# Patient Record
Sex: Female | Born: 1980 | Race: Black or African American | Hispanic: No | Marital: Married | Smoking: Never smoker
Health system: Southern US, Community
[De-identification: ages and names within clinical notes are randomized; demographics above are authoritative.]

## PROBLEM LIST (undated history)

## (undated) ENCOUNTER — Emergency Department (HOSPITAL_COMMUNITY): Admission: EM | Payer: Managed Care, Other (non HMO) | Source: Home / Self Care

## (undated) DIAGNOSIS — T4145XA Adverse effect of unspecified anesthetic, initial encounter: Secondary | ICD-10-CM

## (undated) DIAGNOSIS — T8859XA Other complications of anesthesia, initial encounter: Secondary | ICD-10-CM

## (undated) DIAGNOSIS — D6859 Other primary thrombophilia: Secondary | ICD-10-CM

## (undated) DIAGNOSIS — I82409 Acute embolism and thrombosis of unspecified deep veins of unspecified lower extremity: Secondary | ICD-10-CM

## (undated) HISTORY — DX: Acute embolism and thrombosis of unspecified deep veins of unspecified lower extremity: I82.409

---

## 2007-02-03 ENCOUNTER — Ambulatory Visit: Payer: Self-pay | Admitting: Hematology and Oncology

## 2007-02-12 LAB — CBC WITH DIFFERENTIAL/PLATELET
Basophils Absolute: 0 10*3/uL (ref 0.0–0.1)
EOS%: 0.8 % (ref 0.0–7.0)
Eosinophils Absolute: 0.1 10*3/uL (ref 0.0–0.5)
HCT: 35.4 % (ref 34.8–46.6)
HGB: 11.7 g/dL (ref 11.6–15.9)
MONO#: 0.4 10*3/uL (ref 0.1–0.9)
NEUT#: 4.5 10*3/uL (ref 1.5–6.5)
RDW: 14.8 % — ABNORMAL HIGH (ref 11.3–14.5)
WBC: 7.3 10*3/uL (ref 3.9–10.0)
lymph#: 2.3 10*3/uL (ref 0.9–3.3)

## 2007-02-18 LAB — COMPREHENSIVE METABOLIC PANEL
AST: 15 U/L (ref 0–37)
Albumin: 3.4 g/dL — ABNORMAL LOW (ref 3.5–5.2)
BUN: 11 mg/dL (ref 6–23)
CO2: 27 mEq/L (ref 19–32)
Calcium: 8.9 mg/dL (ref 8.4–10.5)
Chloride: 102 mEq/L (ref 96–112)
Glucose, Bld: 77 mg/dL (ref 70–99)
Potassium: 4.3 mEq/L (ref 3.5–5.3)

## 2007-02-18 LAB — HYPERCOAGULABLE PANEL, COMPREHENSIVE
AntiThromb III Func: 99 % (ref 76–126)
Anticardiolipin IgM: 11 [MPL'U] (ref <10)
Beta-2 Glyco I IgG: 17 U/mL (ref <20)
Beta-2-Glycoprotein I IgM: 19 U/mL (ref <10)
Protein C, Total: 85 % (ref 70–140)
Protein S Activity: 58 % — ABNORMAL LOW (ref 69–129)

## 2008-03-04 ENCOUNTER — Other Ambulatory Visit: Admission: RE | Admit: 2008-03-04 | Discharge: 2008-03-04 | Payer: Self-pay | Admitting: Family Medicine

## 2009-03-07 ENCOUNTER — Other Ambulatory Visit: Admission: RE | Admit: 2009-03-07 | Discharge: 2009-03-07 | Payer: Self-pay | Admitting: Family Medicine

## 2010-03-24 ENCOUNTER — Other Ambulatory Visit
Admission: RE | Admit: 2010-03-24 | Discharge: 2010-03-24 | Payer: Self-pay | Source: Home / Self Care | Admitting: Obstetrics and Gynecology

## 2013-04-15 ENCOUNTER — Encounter (HOSPITAL_COMMUNITY): Payer: 59 | Admitting: Anesthesiology

## 2013-04-15 ENCOUNTER — Other Ambulatory Visit: Payer: Self-pay | Admitting: Obstetrics and Gynecology

## 2013-04-15 ENCOUNTER — Inpatient Hospital Stay (HOSPITAL_COMMUNITY)
Admission: AD | Admit: 2013-04-15 | Discharge: 2013-04-18 | DRG: 765 | Disposition: A | Discharge status: Home / Self Care | Payer: 59 | Source: Ambulatory Visit | Attending: Obstetrics and Gynecology | Admitting: Obstetrics and Gynecology

## 2013-04-15 ENCOUNTER — Inpatient Hospital Stay (HOSPITAL_COMMUNITY): Payer: 59 | Admitting: Anesthesiology

## 2013-04-15 ENCOUNTER — Encounter (HOSPITAL_COMMUNITY): Payer: Self-pay | Admitting: *Deleted

## 2013-04-15 DIAGNOSIS — Z98891 History of uterine scar from previous surgery: Secondary | ICD-10-CM

## 2013-04-15 DIAGNOSIS — O324XX0 Maternal care for high head at term, not applicable or unspecified: Secondary | ICD-10-CM | POA: Diagnosis present

## 2013-04-15 DIAGNOSIS — O9912 Other diseases of the blood and blood-forming organs and certain disorders involving the immune mechanism complicating childbirth: Secondary | ICD-10-CM

## 2013-04-15 DIAGNOSIS — O34219 Maternal care for unspecified type scar from previous cesarean delivery: Secondary | ICD-10-CM | POA: Diagnosis present

## 2013-04-15 DIAGNOSIS — O480 Post-term pregnancy: Secondary | ICD-10-CM | POA: Diagnosis present

## 2013-04-15 DIAGNOSIS — E669 Obesity, unspecified: Secondary | ICD-10-CM | POA: Diagnosis present

## 2013-04-15 DIAGNOSIS — D689 Coagulation defect, unspecified: Secondary | ICD-10-CM | POA: Diagnosis present

## 2013-04-15 DIAGNOSIS — O365990 Maternal care for other known or suspected poor fetal growth, unspecified trimester, not applicable or unspecified: Secondary | ICD-10-CM | POA: Diagnosis present

## 2013-04-15 DIAGNOSIS — O99214 Obesity complicating childbirth: Secondary | ICD-10-CM

## 2013-04-15 DIAGNOSIS — D6859 Other primary thrombophilia: Secondary | ICD-10-CM | POA: Diagnosis present

## 2013-04-15 HISTORY — DX: Other primary thrombophilia: D68.59

## 2013-04-15 LAB — CBC
HCT: 33.9 % — ABNORMAL LOW (ref 36.0–46.0)
HEMOGLOBIN: 10.9 g/dL — AB (ref 12.0–15.0)
MCH: 26.5 pg (ref 26.0–34.0)
MCHC: 32.2 g/dL (ref 30.0–36.0)
MCV: 82.5 fL (ref 78.0–100.0)
Platelets: 254 10*3/uL (ref 150–400)
RBC: 4.11 MIL/uL (ref 3.87–5.11)
RDW: 16 % — ABNORMAL HIGH (ref 11.5–15.5)
WBC: 9.4 10*3/uL (ref 4.0–10.5)

## 2013-04-15 LAB — RPR: RPR: NONREACTIVE

## 2013-04-15 LAB — OB RESULTS CONSOLE RPR: RPR: NONREACTIVE

## 2013-04-15 LAB — OB RESULTS CONSOLE GC/CHLAMYDIA
CHLAMYDIA, DNA PROBE: NEGATIVE
GC PROBE AMP, GENITAL: NEGATIVE

## 2013-04-15 LAB — PREPARE RBC (CROSSMATCH)

## 2013-04-15 LAB — OB RESULTS CONSOLE ABO/RH: RH Type: POSITIVE

## 2013-04-15 LAB — OB RESULTS CONSOLE HEPATITIS B SURFACE ANTIGEN: Hepatitis B Surface Ag: NEGATIVE

## 2013-04-15 LAB — OB RESULTS CONSOLE HIV ANTIBODY (ROUTINE TESTING): HIV: NONREACTIVE

## 2013-04-15 LAB — OB RESULTS CONSOLE GBS: GBS: NEGATIVE

## 2013-04-15 LAB — OB RESULTS CONSOLE ANTIBODY SCREEN: Antibody Screen: NEGATIVE

## 2013-04-15 LAB — OB RESULTS CONSOLE RUBELLA ANTIBODY, IGM: Rubella: NON-IMMUNE/NOT IMMUNE

## 2013-04-15 MED ORDER — EPHEDRINE 5 MG/ML INJ
10.0000 mg | INTRAVENOUS | Status: DC | PRN
  Administered 2013-04-16: 10 mg via INTRAVENOUS

## 2013-04-15 MED ORDER — LACTATED RINGERS IV SOLN
INTRAVENOUS | Status: DC
  Administered 2013-04-15: 18:00:00 via INTRAVENOUS
  Administered 2013-04-16: 125 mL/h via INTRAVENOUS
  Administered 2013-04-16: 09:00:00 via INTRAVENOUS

## 2013-04-15 MED ORDER — PHENYLEPHRINE 40 MCG/ML (10ML) SYRINGE FOR IV PUSH (FOR BLOOD PRESSURE SUPPORT)
80.0000 ug | PREFILLED_SYRINGE | INTRAVENOUS | Status: DC | PRN
  Filled 2013-04-15: qty 10

## 2013-04-15 MED ORDER — OXYTOCIN 40 UNITS IN LACTATED RINGERS INFUSION - SIMPLE MED
1.0000 m[IU]/min | INTRAVENOUS | Status: DC
  Administered 2013-04-15: 2 m[IU]/min via INTRAVENOUS
  Filled 2013-04-15: qty 1000

## 2013-04-15 MED ORDER — DIPHENHYDRAMINE HCL 50 MG/ML IJ SOLN: 12.5000 mg | INTRAMUSCULAR | Status: DC | PRN

## 2013-04-15 MED ORDER — CITRIC ACID-SODIUM CITRATE 334-500 MG/5ML PO SOLN
30.0000 mL | ORAL | Status: DC | PRN
  Administered 2013-04-16: 30 mL via ORAL
  Filled 2013-04-15: qty 15

## 2013-04-15 MED ORDER — OXYTOCIN BOLUS FROM INFUSION
500.0000 mL | INTRAVENOUS | Status: DC
Start: 2013-04-15 — End: 2013-04-16

## 2013-04-15 MED ORDER — IBUPROFEN 600 MG PO TABS: 600.0000 mg | ORAL_TABLET | Freq: Four times a day (QID) | ORAL | Status: DC | PRN

## 2013-04-15 MED ORDER — OXYTOCIN 40 UNITS IN LACTATED RINGERS INFUSION - SIMPLE MED
62.5000 mL/h | INTRAVENOUS | Status: DC
Start: 2013-04-15 — End: 2013-04-16

## 2013-04-15 MED ORDER — LIDOCAINE HCL (PF) 1 % IJ SOLN
INTRAMUSCULAR | Status: DC | PRN
  Administered 2013-04-15 – 2013-04-16 (×4): 5 mL
  Administered 2013-04-16: 3 mL
  Administered 2013-04-16: 5 mL

## 2013-04-15 MED ORDER — LACTATED RINGERS IV SOLN
500.0000 mL | Freq: Once | INTRAVENOUS | Status: AC
  Administered 2013-04-15: 500 mL via INTRAVENOUS

## 2013-04-15 MED ORDER — ONDANSETRON HCL 4 MG/2ML IJ SOLN
4.0000 mg | Freq: Four times a day (QID) | INTRAMUSCULAR | Status: DC | PRN
Start: 2013-04-15 — End: 2013-04-16

## 2013-04-15 MED ORDER — FENTANYL 2.5 MCG/ML BUPIVACAINE 1/10 % EPIDURAL INFUSION (WH - ANES)
14.0000 mL/h | INTRAMUSCULAR | Status: DC | PRN
  Administered 2013-04-16: 14 mL/h via EPIDURAL
  Filled 2013-04-15 (×2): qty 125

## 2013-04-15 MED ORDER — OXYCODONE-ACETAMINOPHEN 5-325 MG PO TABS: 1.0000 | ORAL_TABLET | ORAL | Status: DC | PRN

## 2013-04-15 MED ORDER — ACETAMINOPHEN 325 MG PO TABS
650.0000 mg | ORAL_TABLET | ORAL | Status: DC | PRN
Start: 2013-04-15 — End: 2013-04-16

## 2013-04-15 MED ORDER — LACTATED RINGERS IV SOLN
500.0000 mL | INTRAVENOUS | Status: DC | PRN
  Administered 2013-04-16 (×2): 500 mL via INTRAVENOUS

## 2013-04-15 MED ORDER — EPHEDRINE 5 MG/ML INJ
10.0000 mg | INTRAVENOUS | Status: DC | PRN
  Filled 2013-04-15 (×2): qty 4

## 2013-04-15 MED ORDER — LIDOCAINE HCL (PF) 1 % IJ SOLN
30.0000 mL | INTRAMUSCULAR | Status: DC | PRN
  Filled 2013-04-15: qty 30

## 2013-04-15 MED ORDER — FENTANYL 2.5 MCG/ML BUPIVACAINE 1/10 % EPIDURAL INFUSION (WH - ANES)
INTRAMUSCULAR | Status: DC | PRN
  Administered 2013-04-15: 14 mL/h via EPIDURAL

## 2013-04-15 MED ORDER — TERBUTALINE SULFATE 1 MG/ML IJ SOLN: 0.2500 mg | Freq: Once | INTRAMUSCULAR | Status: AC | PRN

## 2013-04-15 MED ORDER — BUTORPHANOL TARTRATE 1 MG/ML IJ SOLN: 1.0000 mg | INTRAMUSCULAR | Status: DC | PRN

## 2013-04-15 NOTE — Progress Notes (Signed)
  Subjective: Pt is comfortable and coping well with UCs, pt voiced understanding about availability of epidural.    Objective: BP 122/93  Pulse 103  Temp(Src) 97.4 F (36.3 C) (Oral)  Resp 18  Ht 5' (1.524 m)  Wt 208 lb (94.348 kg)  BMI 40.62 kg/m2  LMP 07/03/2012      FHT:  Cat I UC:   regular, every 2-5 minutes  SVE:   Dilation: 3 Effacement (%): 90 Station: 0 Exam by:: Casper Harrison, CNM  Assessment / Plan:  Induction of labor due to BPP 6/8 and post-dates,  progressing well on pitocin  Labor: Progressing on Pitocin, will continue to increase then AROM when appropriate Preeclampsia: no signs or symptoms of toxicity  Fetal Wellbeing: Category I  Pain Control: Labor support without medications  I/D: GBS neg; Intact; Afebrile  Anticipated MOD: NSVD   Brenda Rivers 04/15/2013, 10:06 PM

## 2013-04-15 NOTE — Anesthesia Procedure Notes (Addendum)
Epidural Patient location during procedure: OB  Staffing Anesthesiologist: Montez Hageman Performed by: anesthesiologist   Preanesthetic Checklist Completed: patient identified, site marked, surgical consent, pre-op evaluation, timeout performed, IV checked, risks and benefits discussed and monitors and equipment checked  Epidural Patient position: sitting Prep: ChloraPrep Patient monitoring: heart rate, continuous pulse ox and blood pressure Approach: right paramedian Injection technique: LOR saline  Needle:  Needle type: Tuohy  Needle gauge: 17 G Needle length: 9 cm and 9 Needle insertion depth: 9 cm Catheter type: closed end flexible Catheter size: 20 Guage Catheter at skin depth: 14 cm Test dose: negative  Assessment Events: blood not aspirated, injection not painful, no injection resistance, negative IV test and no paresthesia  Additional Notes   Patient tolerated the insertion well without complications.  Epidural Patient location during procedure: OB  Staffing Anesthesiologist: Montez Hageman Performed by: anesthesiologist   Preanesthetic Checklist Completed: patient identified, site marked, surgical consent, pre-op evaluation, timeout performed, IV checked, risks and benefits discussed and monitors and equipment checked  Epidural Patient position: sitting Prep: ChloraPrep Patient monitoring: heart rate, continuous pulse ox and blood pressure Approach: midline Injection technique: LOR saline  Needle:  Needle type: Tuohy  Needle gauge: 17 G Needle length: 9 cm and 9 Needle insertion depth: 8 cm Catheter type: closed end flexible Catheter size: 20 Guage Catheter at skin depth: 13 cm Test dose: negative  Assessment Events: blood not aspirated, injection not painful, no injection resistance, negative IV test and no paresthesia  Additional Notes Old catheter removed. Tip intact.  Patient tolerated the insertion well without complications.

## 2013-04-15 NOTE — MAU Note (Signed)
Pt presents for induction, BPP 6/8 on baby by ultrasound today.

## 2013-04-15 NOTE — Anesthesia Preprocedure Evaluation (Signed)
Anesthesia Evaluation  Patient identified by MRN, date of birth, ID band Patient awake    Reviewed: Allergy & Precautions, H&P , NPO status , Patient's Chart, lab work & pertinent test results  History of Anesthesia Complications Negative for: history of anesthetic complications  Airway Mallampati: II TM Distance: >3 FB Neck ROM: full    Dental no notable dental hx. (+) Teeth Intact   Pulmonary neg pulmonary ROS,  breath sounds clear to auscultation  Pulmonary exam normal       Cardiovascular negative cardio ROS  Rhythm:regular Rate:Normal     Neuro/Psych negative neurological ROS  negative psych ROS   GI/Hepatic negative GI ROS, Neg liver ROS,   Endo/Other  negative endocrine ROSMorbid obesity  Renal/GU negative Renal ROS  negative genitourinary   Musculoskeletal   Abdominal Normal abdominal exam  (+)   Peds  Hematology negative hematology ROS (+)   Anesthesia Other Findings   Reproductive/Obstetrics (+) Pregnancy                           Anesthesia Physical Anesthesia Plan  ASA: III  Anesthesia Plan: Epidural   Post-op Pain Management:    Induction:   Airway Management Planned:   Additional Equipment:   Intra-op Plan:   Post-operative Plan:   Informed Consent: I have reviewed the patients History and Physical, chart, labs and discussed the procedure including the risks, benefits and alternatives for the proposed anesthesia with the patient or authorized representative who has indicated his/her understanding and acceptance.     Plan Discussed with:   Anesthesia Plan Comments:         Anesthesia Quick Evaluation  

## 2013-04-15 NOTE — H&P (Addendum)
Krysten Marines is a 33 y.o. female G2 P1001 at 40 wks and 6 days  presenting for induction due to abnormal bpp in the office today of 6 out 8 and post dates. Her efw today was less than 10%ile  6 lbs +/- 8 oz.  Her pregnancy has been complicated by h/o cesarean section.  And Protein S deficiency. She desires a trial of labor.     History OB History   Grav Para Term Preterm Abortions TAB SAB Ect Mult Living   2 1 1  0 0 0 0 0 0 1     Past Medical History  Diagnosis Date  . Protein S deficiency    Past Surgical History  Procedure Laterality Date  . Cesarean section     Family History: family history is not on file. Social History:  reports that she has never smoked. She has never used smokeless tobacco. She reports that she does not drink alcohol or use illicit drugs.  Medications:  Heparin last injection was 04/12/2012 PNV   Allergies NkDA     Prenatal Transfer Tool  Maternal Diabetes: No Genetic Screening: Normal Maternal Ultrasounds/Referrals: Normal Fetal Ultrasounds or other Referrals:  None Maternal Substance Abuse:  No Significant Maternal Medications:  None Significant Maternal Lab Results:  None Other Comments:  None  Review of Systems  All other systems reviewed and are negative.      Blood pressure 110/71, pulse 109, temperature 98.3 F (36.8 C), temperature source Oral, resp. rate 18, height 5' (1.524 m), weight 94.348 kg (208 lb), last menstrual period 07/03/2012. Maternal Exam:  Abdomen: Patient reports no abdominal tenderness. Surgical scars: low transverse.   Fetal presentation: vertex  Introitus: Normal vulva. Normal vagina.    Fetal Exam Fetal Monitor Review: Mode: fetoscope.   Variability: moderate (6-25 bpm).   Pattern: no decelerations.    Fetal State Assessment: Category I - tracings are normal.     Physical Exam  Constitutional: She is oriented to person, place, and time.  HENT:  Head: Normocephalic.  Cardiovascular: Normal rate and  regular rhythm.   Respiratory: Effort normal.  Genitourinary: Vagina normal.  Cervix 1.5/ 75/-1  Musculoskeletal: Normal range of motion.  Neurological: She is alert and oriented to person, place, and time.  Skin: Skin is warm and dry.  Psychiatric: She has a normal mood and affect.    Prenatal labs: ABO, Rh:   A positive    Antibody:  Negative   Rubella:  Nonimmune  RPR:   Nonreactive  HBsAg:   Negative  HIV:   Negative  GBS:   Negative   Assessment/Plan: 40 wks and 6 days with abnormal bpp 6 out 8 postdates for delivery.  H/o cesarean section pt desires TOL .Marland Kitchen Pt informed of 1 % r/o uterine rupture with possibility of resulting maternal and fetal morbidity and mortality. She voiced understanding and desires to attempt VBAC Protein S deficiency. SCD's.. Plan restart lovenox if breastfeeding 24 hours post delivery for 6 weeks.    Keatyn Jawad J. 04/15/2013, 3:49 PM

## 2013-04-16 ENCOUNTER — Encounter (HOSPITAL_COMMUNITY): Payer: Self-pay | Admitting: *Deleted

## 2013-04-16 ENCOUNTER — Encounter (HOSPITAL_COMMUNITY)
Admission: AD | Disposition: A | Discharge status: Home / Self Care | Payer: Self-pay | Source: Ambulatory Visit | Attending: Obstetrics and Gynecology

## 2013-04-16 LAB — ABO/RH: ABO/RH(D): A POS

## 2013-04-16 SURGERY — Surgical Case
Anesthesia: Epidural

## 2013-04-16 MED ORDER — MEASLES, MUMPS & RUBELLA VAC ~~LOC~~ INJ
0.5000 mL | INJECTION | Freq: Once | SUBCUTANEOUS | Status: AC
  Administered 2013-04-18: 0.5 mL via SUBCUTANEOUS
  Filled 2013-04-16 (×2): qty 0.5

## 2013-04-16 MED ORDER — DIPHENHYDRAMINE HCL 25 MG PO CAPS: 25.0000 mg | ORAL_CAPSULE | Freq: Four times a day (QID) | ORAL | Status: DC | PRN

## 2013-04-16 MED ORDER — ZOLPIDEM TARTRATE 5 MG PO TABS
5.0000 mg | ORAL_TABLET | Freq: Every evening | ORAL | Status: DC | PRN
Start: 2013-04-16 — End: 2013-04-18

## 2013-04-16 MED ORDER — KETOROLAC TROMETHAMINE 30 MG/ML IJ SOLN
INTRAMUSCULAR | Status: AC
  Filled 2013-04-16: qty 1

## 2013-04-16 MED ORDER — IBUPROFEN 600 MG PO TABS
600.0000 mg | ORAL_TABLET | Freq: Four times a day (QID) | ORAL | Status: DC | PRN
  Administered 2013-04-17: 600 mg via ORAL
  Filled 2013-04-16: qty 1

## 2013-04-16 MED ORDER — NALBUPHINE HCL 10 MG/ML IJ SOLN: 5.0000 mg | INTRAMUSCULAR | Status: DC | PRN

## 2013-04-16 MED ORDER — NALOXONE HCL 1 MG/ML IJ SOLN
1.0000 ug/kg/h | INTRAVENOUS | Status: DC | PRN
  Filled 2013-04-16: qty 2

## 2013-04-16 MED ORDER — PHENYLEPHRINE 8 MG IN D5W 100 ML (0.08MG/ML) PREMIX OPTIME
INJECTION | INTRAVENOUS | Status: AC
  Filled 2013-04-16: qty 100

## 2013-04-16 MED ORDER — MIDAZOLAM HCL 2 MG/2ML IJ SOLN: 0.5000 mg | Freq: Once | INTRAMUSCULAR | Status: DC | PRN

## 2013-04-16 MED ORDER — METOCLOPRAMIDE HCL 5 MG/ML IJ SOLN: 10.0000 mg | Freq: Three times a day (TID) | INTRAMUSCULAR | Status: DC | PRN

## 2013-04-16 MED ORDER — MEPERIDINE HCL 25 MG/ML IJ SOLN: 6.2500 mg | INTRAMUSCULAR | Status: DC | PRN

## 2013-04-16 MED ORDER — BUPIVACAINE HCL (PF) 0.25 % IJ SOLN
INTRAMUSCULAR | Status: DC | PRN
  Administered 2013-04-16 (×2): 5 mL via EPIDURAL

## 2013-04-16 MED ORDER — MORPHINE SULFATE 0.5 MG/ML IJ SOLN
INTRAMUSCULAR | Status: AC
  Filled 2013-04-16: qty 10

## 2013-04-16 MED ORDER — NALOXONE HCL 0.4 MG/ML IJ SOLN: 0.4000 mg | INTRAMUSCULAR | Status: DC | PRN

## 2013-04-16 MED ORDER — OXYTOCIN 10 UNIT/ML IJ SOLN
INTRAMUSCULAR | Status: AC
  Filled 2013-04-16: qty 4

## 2013-04-16 MED ORDER — SCOPOLAMINE 1 MG/3DAYS TD PT72
MEDICATED_PATCH | TRANSDERMAL | Status: AC
  Filled 2013-04-16: qty 1

## 2013-04-16 MED ORDER — 0.9 % SODIUM CHLORIDE (POUR BTL) OPTIME
TOPICAL | Status: DC | PRN
  Administered 2013-04-16: 1000 mL

## 2013-04-16 MED ORDER — OXYTOCIN 10 UNIT/ML IJ SOLN
40.0000 [IU] | INTRAVENOUS | Status: DC | PRN
  Administered 2013-04-16: 40 [IU] via INTRAVENOUS

## 2013-04-16 MED ORDER — CEFAZOLIN SODIUM-DEXTROSE 2-3 GM-% IV SOLR
2.0000 g | INTRAVENOUS | Status: AC
  Administered 2013-04-16: 2 g via INTRAVENOUS
  Filled 2013-04-16: qty 50

## 2013-04-16 MED ORDER — ONDANSETRON HCL 4 MG PO TABS: 4.0000 mg | ORAL_TABLET | ORAL | Status: DC | PRN

## 2013-04-16 MED ORDER — OXYTOCIN 40 UNITS IN LACTATED RINGERS INFUSION - SIMPLE MED: 62.5000 mL/h | INTRAVENOUS | Status: AC

## 2013-04-16 MED ORDER — DIPHENHYDRAMINE HCL 50 MG/ML IJ SOLN: 25.0000 mg | INTRAMUSCULAR | Status: DC | PRN

## 2013-04-16 MED ORDER — MENTHOL 3 MG MT LOZG: 1.0000 | LOZENGE | OROMUCOSAL | Status: DC | PRN

## 2013-04-16 MED ORDER — TETANUS-DIPHTH-ACELL PERTUSSIS 5-2.5-18.5 LF-MCG/0.5 IM SUSP: 0.5000 mL | Freq: Once | INTRAMUSCULAR | Status: DC

## 2013-04-16 MED ORDER — SODIUM BICARBONATE 8.4 % IV SOLN
INTRAVENOUS | Status: AC
  Filled 2013-04-16: qty 50

## 2013-04-16 MED ORDER — LACTATED RINGERS IV SOLN
INTRAVENOUS | Status: DC
  Administered 2013-04-16: 19:00:00 via INTRAVENOUS

## 2013-04-16 MED ORDER — FENTANYL CITRATE 0.05 MG/ML IJ SOLN: 25.0000 ug | INTRAMUSCULAR | Status: DC | PRN

## 2013-04-16 MED ORDER — MORPHINE SULFATE (PF) 0.5 MG/ML IJ SOLN
INTRAMUSCULAR | Status: DC | PRN
  Administered 2013-04-16: 4 mg via EPIDURAL

## 2013-04-16 MED ORDER — OXYCODONE-ACETAMINOPHEN 5-325 MG PO TABS
1.0000 | ORAL_TABLET | ORAL | Status: DC | PRN
  Administered 2013-04-17 – 2013-04-18 (×3): 1 via ORAL
  Filled 2013-04-16 (×3): qty 1

## 2013-04-16 MED ORDER — SODIUM CHLORIDE 0.9 % IJ SOLN: 3.0000 mL | INTRAMUSCULAR | Status: DC | PRN

## 2013-04-16 MED ORDER — KETOROLAC TROMETHAMINE 30 MG/ML IJ SOLN
30.0000 mg | Freq: Four times a day (QID) | INTRAMUSCULAR | Status: AC | PRN
  Administered 2013-04-16: 30 mg via INTRAMUSCULAR

## 2013-04-16 MED ORDER — DIPHENHYDRAMINE HCL 50 MG/ML IJ SOLN: 12.5000 mg | INTRAMUSCULAR | Status: DC | PRN

## 2013-04-16 MED ORDER — LIDOCAINE-EPINEPHRINE (PF) 2 %-1:200000 IJ SOLN
INTRAMUSCULAR | Status: AC
  Filled 2013-04-16: qty 20

## 2013-04-16 MED ORDER — SIMETHICONE 80 MG PO CHEW
80.0000 mg | CHEWABLE_TABLET | ORAL | Status: DC | PRN
Start: 2013-04-16 — End: 2013-04-18
  Filled 2013-04-16: qty 1

## 2013-04-16 MED ORDER — MORPHINE SULFATE (PF) 0.5 MG/ML IJ SOLN
INTRAMUSCULAR | Status: DC | PRN
  Administered 2013-04-16: 1 mg via INTRAVENOUS

## 2013-04-16 MED ORDER — IBUPROFEN 600 MG PO TABS
600.0000 mg | ORAL_TABLET | Freq: Four times a day (QID) | ORAL | Status: DC
  Administered 2013-04-16 – 2013-04-18 (×7): 600 mg via ORAL
  Filled 2013-04-16 (×8): qty 1

## 2013-04-16 MED ORDER — ONDANSETRON HCL 4 MG/2ML IJ SOLN: 4.0000 mg | INTRAMUSCULAR | Status: DC | PRN

## 2013-04-16 MED ORDER — ACETAMINOPHEN 500 MG PO TABS
1000.0000 mg | ORAL_TABLET | Freq: Four times a day (QID) | ORAL | Status: AC
  Administered 2013-04-16 – 2013-04-17 (×3): 1000 mg via ORAL
  Filled 2013-04-16 (×3): qty 2

## 2013-04-16 MED ORDER — SIMETHICONE 80 MG PO CHEW
80.0000 mg | CHEWABLE_TABLET | Freq: Three times a day (TID) | ORAL | Status: DC
  Administered 2013-04-16 – 2013-04-18 (×6): 80 mg via ORAL
  Filled 2013-04-16 (×6): qty 1

## 2013-04-16 MED ORDER — ONDANSETRON HCL 4 MG/2ML IJ SOLN
4.0000 mg | Freq: Three times a day (TID) | INTRAMUSCULAR | Status: DC | PRN
Start: 2013-04-16 — End: 2013-04-18

## 2013-04-16 MED ORDER — LANOLIN HYDROUS EX OINT: 1.0000 "application " | TOPICAL_OINTMENT | CUTANEOUS | Status: DC | PRN

## 2013-04-16 MED ORDER — DIBUCAINE 1 % RE OINT
1.0000 | TOPICAL_OINTMENT | RECTAL | Status: DC | PRN
Start: 2013-04-16 — End: 2013-04-18

## 2013-04-16 MED ORDER — ENOXAPARIN SODIUM 40 MG/0.4ML ~~LOC~~ SOLN
40.0000 mg | SUBCUTANEOUS | Status: DC
  Administered 2013-04-16 – 2013-04-17 (×2): 40 mg via SUBCUTANEOUS
  Filled 2013-04-16 (×3): qty 0.4

## 2013-04-16 MED ORDER — PRENATAL MULTIVITAMIN CH
1.0000 | ORAL_TABLET | Freq: Every day | ORAL | Status: DC
Start: 2013-04-16 — End: 2013-04-18
  Administered 2013-04-18: 1 via ORAL
  Filled 2013-04-16 (×2): qty 1

## 2013-04-16 MED ORDER — SIMETHICONE 80 MG PO CHEW
80.0000 mg | CHEWABLE_TABLET | ORAL | Status: DC
  Administered 2013-04-17: 80 mg via ORAL
  Filled 2013-04-16: qty 1

## 2013-04-16 MED ORDER — CEFAZOLIN SODIUM-DEXTROSE 2-3 GM-% IV SOLR
INTRAVENOUS | Status: AC
  Filled 2013-04-16: qty 50

## 2013-04-16 MED ORDER — SCOPOLAMINE 1 MG/3DAYS TD PT72
1.0000 | MEDICATED_PATCH | Freq: Once | TRANSDERMAL | Status: DC
  Administered 2013-04-16: 1.5 mg via TRANSDERMAL

## 2013-04-16 MED ORDER — SENNOSIDES-DOCUSATE SODIUM 8.6-50 MG PO TABS
2.0000 | ORAL_TABLET | ORAL | Status: DC
  Administered 2013-04-16 – 2013-04-17 (×2): 2 via ORAL
  Filled 2013-04-16 (×2): qty 2

## 2013-04-16 MED ORDER — KETOROLAC TROMETHAMINE 30 MG/ML IJ SOLN: 30.0000 mg | Freq: Four times a day (QID) | INTRAMUSCULAR | Status: AC | PRN

## 2013-04-16 MED ORDER — PROMETHAZINE HCL 25 MG/ML IJ SOLN: 6.2500 mg | INTRAMUSCULAR | Status: DC | PRN

## 2013-04-16 MED ORDER — WITCH HAZEL-GLYCERIN EX PADS: 1.0000 "application " | MEDICATED_PAD | CUTANEOUS | Status: DC | PRN

## 2013-04-16 MED ORDER — SODIUM BICARBONATE 8.4 % IV SOLN
INTRAVENOUS | Status: DC | PRN
  Administered 2013-04-16: 5 mL via EPIDURAL
  Administered 2013-04-16: 10 mL via EPIDURAL

## 2013-04-16 MED ORDER — ONDANSETRON HCL 4 MG/2ML IJ SOLN
INTRAMUSCULAR | Status: AC
  Filled 2013-04-16: qty 2

## 2013-04-16 MED ORDER — DIPHENHYDRAMINE HCL 25 MG PO CAPS
25.0000 mg | ORAL_CAPSULE | ORAL | Status: DC | PRN
  Filled 2013-04-16: qty 1

## 2013-04-16 SURGICAL SUPPLY — 37 items
BARRIER ADHS 3X4 INTERCEED (GAUZE/BANDAGES/DRESSINGS) ×3 IMPLANT
BENZOIN TINCTURE PRP APPL 2/3 (GAUZE/BANDAGES/DRESSINGS) ×3 IMPLANT
CLAMP CORD UMBIL (MISCELLANEOUS) IMPLANT
CLOSURE WOUND 1/2 X4 (GAUZE/BANDAGES/DRESSINGS) ×1
CLOTH BEACON ORANGE TIMEOUT ST (SAFETY) ×3 IMPLANT
DERMABOND ADVANCED (GAUZE/BANDAGES/DRESSINGS)
DERMABOND ADVANCED .7 DNX12 (GAUZE/BANDAGES/DRESSINGS) IMPLANT
DRAPE LG THREE QUARTER DISP (DRAPES) IMPLANT
DRSG OPSITE POSTOP 4X10 (GAUZE/BANDAGES/DRESSINGS) ×3 IMPLANT
DURAPREP 26ML APPLICATOR (WOUND CARE) ×3 IMPLANT
ELECT REM PT RETURN 9FT ADLT (ELECTROSURGICAL) ×3
ELECTRODE REM PT RTRN 9FT ADLT (ELECTROSURGICAL) ×1 IMPLANT
EXTRACTOR VACUUM KIWI (MISCELLANEOUS) IMPLANT
GLOVE BIO SURGEON STRL SZ 6.5 (GLOVE) ×4 IMPLANT
GLOVE BIO SURGEONS STRL SZ 6.5 (GLOVE) ×2
GLOVE BIOGEL PI IND STRL 6.5 (GLOVE) ×2 IMPLANT
GLOVE BIOGEL PI INDICATOR 6.5 (GLOVE) ×4
GOWN STRL REUS W/TWL LRG LVL3 (GOWN DISPOSABLE) ×6 IMPLANT
KIT ABG SYR 3ML LUER SLIP (SYRINGE) IMPLANT
NEEDLE HYPO 25X5/8 SAFETYGLIDE (NEEDLE) IMPLANT
NS IRRIG 1000ML POUR BTL (IV SOLUTION) ×3 IMPLANT
PACK C SECTION WH (CUSTOM PROCEDURE TRAY) ×3 IMPLANT
PAD ABD 7.5X8 STRL (GAUZE/BANDAGES/DRESSINGS) ×3 IMPLANT
PAD OB MATERNITY 4.3X12.25 (PERSONAL CARE ITEMS) ×3 IMPLANT
RTRCTR C-SECT PINK 25CM LRG (MISCELLANEOUS) ×3 IMPLANT
STRIP CLOSURE SKIN 1/2X4 (GAUZE/BANDAGES/DRESSINGS) ×2 IMPLANT
SUT PLAIN 2 0 XLH (SUTURE) IMPLANT
SUT VIC AB 0 CT1 27 (SUTURE) ×4
SUT VIC AB 0 CT1 27XBRD ANBCTR (SUTURE) ×2 IMPLANT
SUT VIC AB 0 CTX 36 (SUTURE) ×6
SUT VIC AB 0 CTX36XBRD ANBCTRL (SUTURE) ×3 IMPLANT
SUT VIC AB 4-0 KS 27 (SUTURE) ×3 IMPLANT
SUT VICRYL 2 0 18  UND BR (SUTURE) ×2
SUT VICRYL 2 0 18 UND BR (SUTURE) ×1 IMPLANT
TOWEL OR 17X24 6PK STRL BLUE (TOWEL DISPOSABLE) ×3 IMPLANT
TRAY FOLEY CATH 14FR (SET/KITS/TRAYS/PACK) IMPLANT
WATER STERILE IRR 1000ML POUR (IV SOLUTION) ×3 IMPLANT

## 2013-04-16 NOTE — Anesthesia Postprocedure Evaluation (Signed)
  Anesthesia Post-op Note  Patient: Brenda Rivers  Procedure(s) Performed: Procedure(s) with comments: [place] Girl @0853  Apgar 8/9 (N/A) - Arrest to descend 41Weeks  Patient Location: Mother/Baby  Anesthesia Type:Epidural  Level of Consciousness: awake, alert  and oriented  Airway and Oxygen Therapy: Patient Spontanous Breathing  Post-op Pain: none  Post-op Assessment: Post-op Vital signs reviewed, Patient's Cardiovascular Status Stable, Respiratory Function Stable, Pain level controlled, No headache, No backache, No residual numbness and No residual motor weakness  Post-op Vital Signs: Reviewed and stable  Complications: No apparent anesthesia complications

## 2013-04-16 NOTE — OR Nursing (Signed)
Fundal massage two finger with below umbilicus and firm per Winfred Burn

## 2013-04-16 NOTE — Lactation Note (Signed)
This note was copied from the chart of Brenda Rivers. Lactation Consultation Note  Patient Name: Brenda Rivers [id number] Date: 04/16/2013 Reason for consult: Initial assessment of this experienced second-time mother who breastfed first child for 6 weeks before returning to work;  Mom states she is planning to pump and nurse newborn longer, if possible.  LC pointed out milk storage and pumping information on p. 25 of Baby and Me andLC encouraged review of Baby and Me pp 9, 14 and 20-25 for STS and BF information. LC provided [employer] brochure and reviewed [facility] services and list of community and web site resources. LC recommends STS and cue feedings.  Baby has had LATCH scores of 7 and 8, per RN assessments and denies any latching problems.  Baby is 8 hours of age and has already had 4 stools.     Maternal Data Formula Feeding for Exclusion: No Infant to breast within first hour of birth: Yes (latched well and sucked rhythmically, per PACU RN) Has patient been taught Hand Expression?: Yes (mom states she knows how to hand express) Does the patient have breastfeeding experience prior to this delivery?: Yes  Feeding Feeding Type: Breast Fed  LATCH Score/Interventions Latch: Grasps breast easily, tongue down, lips flanged, rhythmical sucking.  Audible Swallowing: A few with stimulation  Type of Nipple: Everted at rest and after stimulation  Comfort (Breast/Nipple): Soft / non-tender     Hold (Positioning): Assistance needed to correctly position infant at breast and maintain latch.  LATCH Score: 8 (most recent feeding assesment, per RN)  Lactation Tools Discussed/Used   STS, cue feedings, hand expression  Consult Status Consult Status: Follow-up Date: 04/17/13 Follow-up type: In-patient    Junious Dresser [facility] 04/16/2013, 5:27 PM

## 2013-04-16 NOTE — Addendum Note (Signed)
Addendum created 04/16/13 1335 by Jonna Munro, CRNA   Modules edited: Notes Section   Notes Section:  File: [id number]

## 2013-04-16 NOTE — Op Note (Signed)
PreOp Diagnosis:  -Intrauterine pregnancy @ [redacted]w[redacted]d-Failed Induction of Labor for postdates and BPP 6/8 -IUGR -History of prior C-section -Arrest of descent with suspected CPD -Protein S deficiency -Obesity PostOp Diagnosis: same Procedure: Repeat Low transverse C-section Surgeon: Dr. JJanyth PupaAssistant: Dr. BCyndia SkeetersAnesthesia: epidural Complications: none EBL: 500cc UOP: 50cc Fluids: 1300cc  INDICATIONS: 89yoG2P1001 @ [id number] presented for IOL due to BPP 6/8, postdates and recent [place] IUGR as growth was <10%.  Induction was performed using Pitocin, pt received an epidural for pain management.  Due to intermittent tracing, FSE was placed.  Patient progressed to complete dilation, +1 station.  Despite adequate maternal effort with pushing for over 3 hours the patient did not progress past +1 station.  Due to arrest of descent with suspected CPD, the patient was taken for a repeat C-section.  Dr. VaSimona Huhill be present to assist in delivery due to repeat C-section.  Pregnancy complicated by: 1) Protein S deficiency- pt has been on anti-coagulation with heparin throughout pregnancy -will restart on Lovenox post delivery 2) Obesity, BMI: 40 3) Prior C-section for arrest of dilation 4) Rubella non-immune, will need MMR postpartum  Findings: Female infant from cephalic presentation, OP and asynclitic with loose nuchal cord.  Normal uterus, tubes and ovaries bilaterally.  PROCEDURE:  The patient was taken to Operating Room, and identified with the procedure verified as C-Section Delivery with Time Out. With induction of anesthesia, the patient was prepped and draped in the usual sterile fashion. A Pfannenstiel incision was made through the prior incision and carried down through the subcutaneous tissue to the fascia. The fascia was incised in the midline and extended transversely. The inferior aspect of the fascial incision was grasped with Kochers elevated and the  underlying muscle dissected off. The superior aspect of the facial incision was in similar fashion, grasped elevated and rectus muscles dissected off. The peritoneum was identified and entered. Peritoneal incision was extended longitudinally.  The Alexis retractor was placed.  The utero-vesical peritoneal reflection was identified and incised transversely with the MeSur[facility] Professional [facility] LLCcissors, the incision extended laterally, the bladder flap created digitally. A low transverse uterine incision was made and the infants head delivered atraumatically. After the umbilical cord was clamped and cut cord.   The placenta was removed intact and appeared normal. The uterine outline, tubes and ovaries appeared normal. The uterine incision was closed with running locked sutures of 0 Vicryl and a second layer of the same stitch was used in an imbricating fashion.  Excellent hemostasis was obtained.  The pericolic gutters were then cleared of all clots and debris. Interceed was placed over the incision.  Two interrupted stitches were placed using 2-0 vicryl to re-approximate the rectus.  The fascia was then reapproximated with running sutures of 0 Vicryl. The skin was closed with 4-0 vicryl in a subcuticular fashion.  Instrument, sponge, and needle counts were correct prior the abdominal closure and at the conclusion of the case. The patient was taken to recovery in stable condition.  JeJanyth PupaDO [phone]) [phone])

## 2013-04-16 NOTE — Anesthesia Postprocedure Evaluation (Signed)
Anesthesia Post Note  Patient: Brenda Rivers  Procedure(s) Performed: Procedure(s) (LRB): CESAREAN SECTION Baby Girl @0853  Apgar 8/9 (N/A)  Anesthesia type: Epidural  Patient location: PACU  Post pain: Pain level controlled  Post assessment: Post-op Vital signs reviewed  Last Vitals:  Filed Vitals:   04/16/13 0945  BP: 103/56  Pulse: 105  Temp:   Resp: 24    Post vital signs: Reviewed  Level of consciousness: awake  Complications: No apparent anesthesia complications

## 2013-04-16 NOTE — Progress Notes (Signed)
  Subjective: Good maternal effort in pushing (since 0516).  Difficult to trace FHT.  Objective: BP 114/72  Pulse 135  Temp(Src) 97.8 F (36.6 C) (Oral)  Resp 20  Ht 5' (1.524 m)  Wt 208 lb (94.348 kg)  BMI 40.62 kg/m2  SpO2 100%  LMP 07/03/2012   Total I/O In: -  Out: 350 [Urine:350]  FHT:  Cat II UC:   regular, every 1-4 minutes  SVE:   Dilation: 10 Effacement (%): 100 Station: +1 Exam by:: J. Keimya Briddell CNM  Assessment / Plan:  FSE applied, well tolerated  Izick Gasbarro 04/16/2013, 6:08 AM

## 2013-04-16 NOTE — Progress Notes (Signed)
  Subjective: Pt resting comfortably, continues to push with good maternal effort (since 0516).    Objective: BP 103/73  Pulse 115  Temp(Src) 97.6 F (36.4 C) (Axillary)  Resp 20  Ht 5' (1.524 m)  Wt 94.348 kg (208 lb)  BMI 40.62 kg/m2  SpO2 84%  LMP 07/03/2012 I/O last 3 completed shifts: In: -  Out: 350 [Urine:350]    FHT:  Cat II UC:   regular, every 1-4 minutes SVE: C/C/+1 with caput, FSE in place  Assessment / Plan: 32yo G2P1001@[redacted]w[redacted]d  in active labor. 1. FWB- Cat. II, overall reassuring. TOLAC- Despite adequate maternal efforts for ~3hr of pushing, station has remained unchanged with presentation of caput only-concern for CPD.  Discussed options with patient and plan for repeat C-section due to arrest of descent with suspected CPD. Risk benefits and alternatives of cesarean section were discussed with the patient including but not limited to infection, bleeding, damage to bowel , bladder and baby with the need for further surgery. Pt voiced understanding and desires to proceed.   Janyth Pupa, M 04/16/2013, 8:16 AM

## 2013-04-16 NOTE — Transfer of Care (Signed)
Immediate Anesthesia Transfer of Care Note  Patient: Brenda Rivers  Procedure(s) Performed: Procedure(s) with comments: [place] Girl @0853  Apgar 8/9 (N/A) - Arrest to descend 41Weeks  Patient Location: PACU  Anesthesia Type:Epidural  Level of Consciousness: awake, alert  and oriented  Airway & Oxygen Therapy: Patient Spontanous Breathing  Post-op Assessment: Report given to PACU RN and Post -op Vital signs reviewed and stable  Post vital signs: Reviewed and stable  Complications: No apparent anesthesia complications

## 2013-04-17 ENCOUNTER — Encounter (HOSPITAL_COMMUNITY): Payer: Self-pay | Admitting: Obstetrics & Gynecology

## 2013-04-17 DIAGNOSIS — Z98891 History of uterine scar from previous surgery: Secondary | ICD-10-CM

## 2013-04-17 LAB — CBC
HEMATOCRIT: 29.5 % — AB (ref 36.0–46.0)
Hemoglobin: 9.3 g/dL — ABNORMAL LOW (ref 12.0–15.0)
MCH: 26.2 pg (ref 26.0–34.0)
MCHC: 31.5 g/dL (ref 30.0–36.0)
MCV: 83.1 fL (ref 78.0–100.0)
PLATELETS: 208 10*3/uL (ref 150–400)
RBC: 3.55 MIL/uL — ABNORMAL LOW (ref 3.87–5.11)
RDW: 16.1 % — AB (ref 11.5–15.5)
WBC: 11.6 10*3/uL — AB (ref 4.0–10.5)

## 2013-04-17 NOTE — Progress Notes (Signed)
Subjective: Postpartum Day 1: Cesarean Delivery Patient reports tolerating PO, + flatus and no problems voiding.    Objective: Vital signs in last 24 hours: Temp:  [97.7 F (36.5 C)-98.3 F (36.8 C)] 98.2 F (36.8 C) (02/13 0600) Pulse Rate:  [82-109] 82 (02/13 0600) Resp:  [20] 20 (02/13 0600) BP: (78-105)/(42-70) 82/64 mmHg (02/13 0600) SpO2:  [95 %-97 %] 97 % (02/13 0600)  Physical Exam:  General: alert and cooperative Lochia: appropriate Uterine Fundus: firm Incision: healing well DVT Evaluation: No evidence of DVT seen on physical exam.   Recent Labs  04/15/13 1810 04/17/13 0600  HGB 10.9* 9.3*  HCT 33.9* 29.5*    Assessment/Plan: Status post Cesarean section. Doing well postoperatively.  Continue current care Protein S deficiency .Marland Kitchen Home on lovenox 40 mg [place] daily until 6 wks ppv...  Follow up with Dr. Landry Mellow in 2 wks for incision check. Catha Brow. 04/17/2013, 4:53 PM

## 2013-04-17 NOTE — Lactation Note (Signed)
This note was copied from the chart of Brenda Rivers. Lactation Consultation Note Follow up consult:  [name] 16 hours old.  P2.  Mother states baby recently brestfed for 60 minutes.  Mother denies soreness but is exhausted.  Reviewed cluster feeding and supply and demand.  Encouraged mother to call if assistance is needed.   Patient Name: Brenda Rivers [id number] Date: 04/17/2013 Reason for consult: Follow-up assessment   Maternal Data    Feeding Feeding Type: Breast Fed Length of feed: 60 min  LATCH Score/Interventions Latch: Grasps breast easily, tongue down, lips flanged, rhythmical sucking.  Audible Swallowing: A few with stimulation Intervention(s): Skin to skin  Type of Nipple: Everted at rest and after stimulation  Comfort (Breast/Nipple): Soft / non-tender     Hold (Positioning): Assistance needed to correctly position infant at breast and maintain latch. Intervention(s): Support Pillows  LATCH Score: 8  Lactation Tools Discussed/Used     Consult Status Consult Status: Follow-up Date: 04/18/13 Follow-up type: In-patient    Vivianne Master [facility] 04/17/2013, 10:02 PM

## 2013-04-18 MED ORDER — IRON POLYSACCH CMPLX-B12-FA 150-0.025-1 MG PO CAPS: 1.0000 | ORAL_CAPSULE | Freq: Two times a day (BID) | ORAL | Status: DC

## 2013-04-18 MED ORDER — IBUPROFEN 600 MG PO TABS: 600.0000 mg | ORAL_TABLET | Freq: Four times a day (QID) | ORAL | Status: DC | PRN

## 2013-04-18 MED ORDER — ENOXAPARIN SODIUM 40 MG/0.4ML ~~LOC~~ SOLN: 40.0000 mg | SUBCUTANEOUS | Status: DC

## 2013-04-18 MED ORDER — OXYCODONE-ACETAMINOPHEN 5-325 MG PO TABS: 1.0000 | ORAL_TABLET | ORAL | Status: DC | PRN

## 2013-04-18 MED ORDER — BISACODYL 10 MG RE SUPP
10.0000 mg | Freq: Once | RECTAL | Status: AC
  Administered 2013-04-18: 10 mg via RECTAL
  Filled 2013-04-18: qty 1

## 2013-04-18 NOTE — Discharge Instructions (Signed)
Cesarean Delivery Care After Refer to this sheet in the next few weeks. These instructions provide you with information on caring for yourself after your procedure. Your health care provider may also give you specific instructions. Your treatment has been planned according to current medical practices, but problems sometimes occur. Call your health care provider if you have any problems or questions after you go home. HOME CARE INSTRUCTIONS  Only take over-the-counter or prescription medications as directed by your health care provider.  Do not drink alcohol, especially if you are breastfeeding or taking medication to relieve pain.  Do not chew or smoke tobacco.  Continue to use good perineal care. Good perineal care includes:  Wiping your perineum from front to back.  Keeping your perineum clean.  Check your surgical cut (incision) daily for increased redness, drainage, swelling, or separation of skin.  Clean your incision gently with soap and water every day, and then pat it dry. If your health care provider says it is OK, leave the incision uncovered. Use a bandage (dressing) if the incision is draining fluid or appears irritated. If the adhesive strips across the incision do not fall off within 7 days, carefully peel them off.  Hug a pillow when coughing or sneezing until your incision is healed. This helps to relieve pain.  Do not use tampons or douche until your health care provider says it is okay.  Shower, wash your hair, and take tub baths as directed by your health care provider.  Wear a well-fitting bra that provides breast support.  Limit wearing support panties or control-top hose.  Drink enough fluids to keep your urine clear or pale yellow.  Eat high-fiber foods such as whole grain cereals and breads, brown rice, beans, and fresh fruits and vegetables every day. These foods may help prevent or relieve constipation.  Resume activities such as climbing stairs,  driving, lifting, exercising, or traveling as directed by your health care provider.  Talk to your health care provider about resuming sexual activities. This is dependent upon your risk of infection, your rate of healing, and your comfort and desire to resume sexual activity.  Try to have someone help you with your household activities and your newborn for at least a few days after you leave the hospital.  Rest as much as possible. Try to rest or take a nap when your newborn is sleeping.  Increase your activities gradually.  Keep all of your scheduled postpartum appointments. It is very important to keep your scheduled follow-up appointments. At these appointments, your health care provider will be checking to make sure that you are healing physically and emotionally. SEEK MEDICAL CARE IF:   You are passing large clots from your vagina. Save any clots to show your health care provider.  You have a foul smelling discharge from your vagina.  You have trouble urinating.  You are urinating frequently.  You have pain when you urinate.  You have a change in your bowel movements.  You have increasing redness, pain, or swelling near your incision.  You have pus draining from your incision.  Your incision is separating.  You have painful, hard, or reddened breasts.  You have a severe headache.  You have blurred vision or see spots.  You feel sad or depressed.  You have thoughts of hurting yourself or your newborn.  You have questions about your care, the care of your newborn, or medications.  You are dizzy or lightheaded.  You have a rash.  You  have pain, redness, or swelling at the site of the removed intravenous access (IV) tube.  You have nausea or vomiting.  You stopped breastfeeding and have not had a menstrual period within 12 weeks of stopping.  You are not breastfeeding and have not had a menstrual period within 12 weeks of delivery.  You have a fever. SEEK  IMMEDIATE MEDICAL CARE IF:  You have persistent pain.  You have chest pain.  You have shortness of breath.  You faint.  You have leg pain.  You have stomach pain.  Your vaginal bleeding saturates 2 or more sanitary pads in 1 hour. MAKE SURE YOU:   Understand these instructions.  Will watch your condition.  Will get help right away if you are not doing well or get worse. Document Released: 11/11/2001 Document Revised: 10/22/2012 Document Reviewed: 10/17/2011 [facility] Patient Information 2014 [place].  Postpartum Depression and Baby Blues The postpartum period begins right after the birth of a baby. During this time, there is often a great amount of joy and excitement. It is also a time of considerable changes in the life of the parent(s). Regardless of how many times a mother gives birth, each child brings new challenges and dynamics to the family. It is not unusual to have feelings of excitement accompanied by confusing shifts in moods, emotions, and thoughts. All mothers are at risk of developing postpartum depression or the "baby blues." These mood changes can occur right after giving birth, or they may occur many months after giving birth. The baby blues or postpartum depression can be mild or severe. Additionally, postpartum depression can resolve rather quickly, or it can be a long-term condition. CAUSES Elevated hormones and their rapid decline are thought to be a main cause of postpartum depression and the baby blues. There are a number of hormones that radically change during and after pregnancy. Estrogen and progesterone usually decrease immediately after delivering your baby. The level of thyroid hormone and various cortisol steroids also rapidly drop. Other factors that play a major role in these changes include major life events and genetics.  RISK FACTORS If you have any of the following risks for the baby blues or postpartum depression, know what symptoms to watch  out for during the postpartum period. Risk factors that may increase the likelihood of getting the baby blues or postpartum depression include:  Havinga personal or family history of depression.  Having depression while being pregnant.  Having premenstrual or oral contraceptive-associated mood issues.  Having exceptional life stress.  Having marital conflict.  Lacking a social support network.  Having a baby with special needs.  Having health problems such as diabetes. SYMPTOMS Baby blues symptoms include:  Brief fluctuations in mood, such as going from extreme happiness to sadness.  Decreased concentration.  Difficulty sleeping.  Crying spells, tearfulness.  Irritability.  Anxiety. Postpartum depression symptoms typically begin within the first month after giving birth. These symptoms include:  Difficulty sleeping or excessive sleepiness.  Marked weight loss.  Agitation.  Feelings of worthlessness.  Lack of interest in activity or food. Postpartum psychosis is a very concerning condition and can be dangerous. Fortunately, it is rare. Displaying any of the following symptoms is cause for immediate medical attention. Postpartum psychosis symptoms include:  Hallucinations and delusions.  Bizarre or disorganized behavior.  Confusion or disorientation. DIAGNOSIS  A diagnosis is made by an evaluation of your symptoms. There are no medical or lab tests that lead to a diagnosis, but there are various questionnaires  that a caregiver may use to identify those with the baby blues, postpartum depression, or psychosis. Often times, a screening tool called the [place] Postnatal Depression Scale is used to diagnose depression in the postpartum period.  TREATMENT The baby blues usually goes away on its own in 1 to 2 weeks. Social support is often all that is needed. You should be encouraged to get adequate sleep and rest. Occasionally, you may be given medicines to help you  sleep.  Postpartum depression requires treatment as it can last several months or longer if it is not treated. Treatment may include individual or group therapy, medicine, or both to address any social, physiological, and psychological factors that may play a role in the depression. Regular exercise, a healthy diet, rest, and social support may also be strongly recommended.  Postpartum psychosis is more serious and needs treatment right away. Hospitalization is often needed. HOME CARE INSTRUCTIONS  Get as much rest as you can. Nap when the baby sleeps.  Exercise regularly. Some women find yoga and walking to be beneficial.  Eat a balanced and nourishing diet.  Do little things that you enjoy. Have a cup of tea, take a bubble bath, read your favorite magazine, or listen to your favorite music.  Avoid alcohol.  Ask for help with household chores, cooking, grocery shopping, or running errands as needed. Do not try to do everything.  Talk to people close to you about how you are feeling. Get support from your partner, family members, friends, or other new moms.    Try to stay positive in how you think. Think about the things you are grateful for.  Do not spend a lot of time alone.  Only take medicine as directed by your caregiver.  Keep all your postpartum appointments.  Let your caregiver know if you have any concerns. SEEK MEDICAL CARE IF: You are having a reaction or problems with your medicine. SEEK IMMEDIATE MEDICAL CARE IF:  You have suicidal feelings.  You feel you may harm the baby or someone else. Document Released: 11/24/2003 Document Revised: 05/14/2011 Document Reviewed: 12/01/2012 [facility] Patient Information 2014 [place], [place].  Enoxaparin, Home Use Enoxaparin (Lovenox) injection is a medication used to prevent clots from developing in your veins. Medications such as enoxaparin are called blood thinners or anticoagulants. If blood clots are untreated they could  travel to your lungs. This is called a pulmonary embolus. A blood clot in your lungs can be fatal. Caregivers often use anticoagulants such as enoxaparin to prevent clots following surgery. It is also used along with aspirin when the heart is not getting enough blood. Continue the enoxaparin injections as directed by your caregiver. Your caregiver will use blood clotting test results to decide when you can safely stop using enoxaparin injections. If your caregiver prescribes any additional anticoagulant, you must take it exactly as directed. RISKS AND COMPLICATIONS  If you have received recent epidural anesthesia, spinal anesthesia, or a spinal tap while receiving anticoagulants, you are at risk for developing a blood clot in or around the spine. This condition could result in long-term or permanent paralysis.  Because anticoagulants thin your blood, severe bleeding may occur from any tissue or organ. Symptoms of the blood being too thin may include:  Bleeding from the nose or gums that does not stop quickly.  Unusual bruising or bruising easily.  Swelling or pain at an injection site.  A cut that does not stop bleeding within 10 minutes.  Continual nausea for more than  1 day or vomiting blood.  Coughing up blood.  Blood in the urine which may appear as pink, red, or brown urine.  Blood in bowel movements which may appear as red, dark or black stools.  Sudden weakness or numbness of the face, arm, or leg, especially on one side of the body.  Sudden confusion.  Trouble speaking (aphasia) or understanding.  Sudden trouble seeing in one or both eyes.  Sudden trouble walking.  Dizziness.  Loss of balance or coordination.  Severe pain, such as a headache, joint pain, or back pain.  Fever.  Bruising around the injection sites may be expected.  Platelet drops, known as "thrombocytopenia," can occur with enoxaparin use. A condition called "heparin-induced thrombocytopenia" has  been seen. If you have had this condition, you should tell your caregiver. Your caregiver may direct you to have blood tests to monitor this condition.  Do not use if you have allergies to the medication, heparin, or pork products.  Other side effects may include mild local reactions or irritation at the site of injection, pain, bruising, and redness of skin. HOME CARE INSTRUCTIONS You will be instructed by your caregiver how to give enoxaparin injections. 1. Before giving your medication you should make sure the injection is a clear and colorless or pale yellow solution. If your medication becomes discolored or has particles in the bottle, do not use and notify your caregiver. 2. When using the 30 and 40 mg pre-filled syringes, do not expel the air bubble from the syringe before the injection. This makes sure you use all the medication in the syringe. 3. The injections will be given subcutaneously. This means it is given into the fat over the belly (abdomen). It is given deep beneath the skin but not into the muscle. The shots should be injected around the abdominal wall. Change the sites of injection each time. The whole length of the needle should be introduced into a skin fold held between the thumb and forefinger; the skin fold should be held throughout the injection. Do not rub the injection site after completion of the injection. This increases bruising. Enoxaparin injection pre-filled syringes and graduated pre-filled syringes are available with a system that shields the needle after injection. 4. Inject by pushing the plunger to the bottom of the syringe. 5. Remove the syringe from the injection site keeping your finger on the plunger rod. Be careful not to stick yourself or others. 6. After injection and the syringe is empty, set off the safety system by firmly pushing the plunger rod. The protective sleeve will automatically cover the needle and you can hear a click. The click means your  needle is safely covered. Do not try replacing the needle shield. 7. Get rid of the syringe in the nearest sharps container. 8. Keep your medication safely stored at room temperatures.  Due to the complications of anticoagulants, it is very important that you take your anticoagulant as directed by your caregiver. Anticoagulants need to be taken exactly as instructed. Be sure you understand all your anticoagulant instructions.  Changes in medicines, supplements, diet, and illness can affect your anticoagulation therapy. Be sure to inform your caregivers of any of these changes.  While on anticoagulants, you will need to have blood tests done routinely as directed by your caregivers.  Be careful not to cut yourself when using sharp objects.  Limit physical activities or sports that could result in a fall or cause injury.  It is extremely important that you tell  all of your caregivers and dentist that you are taking an anticoagulant, especially if you are injured or plan to have any type of procedure or operation.  Follow up with your laboratory test and caregiver appointments as directed. It is very important to keep your appointments. Not keeping appointments could result in a chronic or permanent injury, pain, or disability. SEEK MEDICAL CARE IF:  You develop any rashes.  You have any worsening of the condition for which you are receiving anticoagulation therapy. SEEK IMMEDIATE MEDICAL CARE IF:  Bleeding from the nose or gums does not stop quickly.  You have unusual bruising or are bruising easily.  Swelling or pain occurs at an injection site.  A cut does not stop bleeding within 10 minutes.  You have continual nausea for more than 1 day or are vomiting blood.  You are coughing up blood.  You have blood in the urine.  You have dark or black stools.  You have sudden weakness or numbness of the face, arm, or leg, especially on one side of the body.  You have sudden  confusion.  You have trouble speaking (aphasia) or understanding.  You have sudden trouble seeing in one or both eyes.  You have sudden trouble walking.  You have dizziness.  You have a loss of balance or coordination.  You have severe pain, such as a headache, joint pain, or back pain.  You have a serious fall or head injury, even if you are not bleeding.  You have an oral temperature above 102 F (38.9 C), not controlled by medicine. ANY OF THESE SYMPTOMS MAY REPRESENT A SERIOUS PROBLEM THAT IS AN EMERGENCY. Do not wait to see if the symptoms will go away. Get medical help right away. Call your local emergency services (911 in [place] DO NOT drive yourself to the hospital. MAKE SURE YOU:  Understand these instructions.  Will watch your condition.  Will get help right away if you are not doing well or get worse. Document Released: 12/22/2003 Document Revised: 05/14/2011 Document Reviewed: 02/19/2005 [facility] Patient Information 2014 [place].

## 2013-04-18 NOTE — Discharge Summary (Signed)
Cesarean Section Delivery Discharge Summary  Brenda Rivers  DOB:    1980/05/01 MRN:    [id number] CSN:    [id number]  Date of admission:                  04/15/13  Date of discharge:                   04/18/13  Procedures this admission:  Date of Delivery: 04/16/13   Newborn Data:  Live born female  Birth Weight: 6 lb 8.6 oz (2965 g) APGAR: 8, 9  Home with mother.   History of Present Illness:  Ms. Brenda Rivers is a 33 y.o. female, G2P2002, who presents at [redacted]w[redacted]d weeks gestation. The patient has been followed at [facility]    Her pregnancy has been complicated by:  Patient Active Problem List   Diagnosis Date Noted  . Status post repeat low transverse cesarean section 04/17/2013  . Post-dates pregnancy 04/15/2013   NST and ultrasound.  Hospital course:  The patient was admitted for abnormal BPP, PD, IUGR. She had IOL w pitocin, rcv'd epidural, and pushed for over 3hrs, without delivery. Decision was made to perform repeat c/s. Done by Dr Nelda Marseille and Dr Simona Huh, without any complications.    Her postpartum course was complicated by mild anemia, without hemodynamic changes.  She was discharged to home on postpartum day 2 doing well.  Feeding:  breast  Contraception:  undecided, various methods discussed, strongly considering vasectomy   Discharge hemoglobin:  HGB  Date Value Ref Range Status  02/12/2007 11.7  11.6 - 15.9 g/dL Final     Hemoglobin  Date Value Ref Range Status  04/17/2013 9.3* 12.0 - 15.0 g/dL Final     HCT  Date Value Ref Range Status  04/17/2013 29.5* 36.0 - 46.0 % Final  02/12/2007 35.4  34.8 - 46.6 % Final    Discharge Physical Exam:   General: alert and no distress Lungs, CTAB Cardiac: RRR Lochia: appropriate Uterine Fundus: firm Incision: healing well DVT Evaluation: No evidence of DVT seen on physical exam. Negative Homan's sign. No significant calf/ankle edema.  Intrapartum Procedures: IOL w pitocin, epidural, pushing,  repeat LTCS Postpartum Procedures: none Complications-Operative and Postpartum: none  Discharge Diagnoses: Term Pregnancy-delivered  Discharge Information:  Activity:           pelvic rest Diet:                routine Medications: PNV, Ibuprofen, Iron and Percocet Condition:      stable Instructions:  Care After Cesarean Delivery  Refer to this sheet in the next few weeks. These instructions provide you with information on caring for yourself after your procedure. Your caregiver may also give you specific instructions. Your treatment has been planned according to current medical practices, but problems sometimes occur. Call your caregiver if you have any problems or questions after you go home. HOME CARE INSTRUCTIONS  Only take over-the-counter or prescription medicines as directed by your caregiver.  Do not drink alcohol, especially if you are breastfeeding or taking medicine to relieve pain.  Do not chew or smoke tobacco.  Continue to use good perineal care. Good perineal care includes:  Wiping your perineum from front to back.  Keeping your perineum clean.  Check your cut (incision) daily for increased redness, drainage, swelling, or separation of skin.  Clean your incision gently with soap and water every day, and then pat it dry. If your caregiver says it is okay,  leave the incision uncovered. Use a bandage (dressing) if the incision is draining fluid or appears irritated. If the adhesive strips across the incision do not fall off within 7 days, carefully peel them off.  Hug a pillow when coughing or sneezing until your incision is healed. This helps to relieve pain.  Do not use tampons or douche until your caregiver says it is okay.  Shower, wash your hair, and take tub baths as directed by your caregiver.  Wear a well-fitting bra that provides breast support.  Limit wearing support panties or control-top hose.  Drink enough fluids to keep your urine clear or pale  yellow.  Eat high-fiber foods such as whole grain cereals and breads, brown rice, beans, and fresh fruits and vegetables every day. These foods may help prevent or relieve constipation.  Resume activities such as climbing stairs, driving, lifting, exercising, or traveling as directed by your caregiver.  Talk to your caregiver about resuming sexual activities. This is dependent upon your risk of infection, your rate of healing, and your comfort and desire to resume sexual activity.  Try to have someone help you with your household activities and your newborn for at least a few days after you leave the hospital.  Rest as much as possible. Try to rest or take a nap when your newborn is sleeping.  Increase your activities gradually.  Keep all of your scheduled postpartum appointments. It is very important to keep your scheduled follow-up appointments. At these appointments, your caregiver will be checking to make sure that you are healing physically and emotionally. SEEK MEDICAL CARE IF:   You are passing large clots from your vagina. Save any clots to show your caregiver.  You have a foul smelling discharge from your vagina.  You have trouble urinating.  You are urinating frequently.  You have pain when you urinate.  You have a change in your bowel movements.  You have increasing redness, pain, or swelling near your incision.  You have pus draining from your incision.  Your incision is separating.  You have painful, hard, or reddened breasts.  You have a severe headache.  You have blurred vision or see spots.  You feel sad or depressed.  You have thoughts of hurting yourself or your newborn.  You have questions about your care, the care of your newborn, or medicines.  You are dizzy or lightheaded.  You have a rash.  You have pain, redness, or swelling at the site of the removed intravenous access (IV) tube.  You have nausea or vomiting.  You stopped breastfeeding  and have not had a menstrual period within 12 weeks of stopping.  You are not breastfeeding and have not had a menstrual period within 12 weeks of delivery.  You have a fever. SEEK IMMEDIATE MEDICAL CARE IF:  You have persistent pain.  You have chest pain.  You have shortness of breath.  You faint.  You have leg pain.  You have stomach pain.  Your vaginal bleeding saturates 2 or more sanitary pads in 1 hour. MAKE SURE YOU:   Understand these instructions.  Will watch your condition.  Will get help right away if you are not doing well or get worse. Document Released: 11/11/2001 Document Revised: 11/14/2011 Document Reviewed: 10/17/2011 [facility] Patient Information 2014 [place].   Postpartum Depression and Baby Blues  The postpartum period begins right after the birth of a baby. During this time, there is often a great amount of joy and excitement. It  is also a time of considerable changes in the life of the parent(s). Regardless of how many times a mother gives birth, each child brings new challenges and dynamics to the family. It is not unusual to have feelings of excitement accompanied by confusing shifts in moods, emotions, and thoughts. All mothers are at risk of developing postpartum depression or the "baby blues." These mood changes can occur right after giving birth, or they may occur many months after giving birth. The baby blues or postpartum depression can be mild or severe. Additionally, postpartum depression can resolve rather quickly, or it can be a long-term condition. CAUSES Elevated hormones and their rapid decline are thought to be a main cause of postpartum depression and the baby blues. There are a number of hormones that radically change during and after pregnancy. Estrogen and progesterone usually decrease immediately after delivering your baby. The level of thyroid hormone and various cortisol steroids also rapidly drop. Other factors that play a major  role in these changes include major life events and genetics.  RISK FACTORS If you have any of the following risks for the baby blues or postpartum depression, know what symptoms to watch out for during the postpartum period. Risk factors that may increase the likelihood of getting the baby blues or postpartum depression include:  Havinga personal or family history of depression.  Having depression while being pregnant.  Having premenstrual or oral contraceptive-associated mood issues.  Having exceptional life stress.  Having marital conflict.  Lacking a social support network.  Having a baby with special needs.  Having health problems such as diabetes. SYMPTOMS Baby blues symptoms include:  Brief fluctuations in mood, such as going from extreme happiness to sadness.  Decreased concentration.  Difficulty sleeping.  Crying spells, tearfulness.  Irritability.  Anxiety. Postpartum depression symptoms typically begin within the first month after giving birth. These symptoms include:  Difficulty sleeping or excessive sleepiness.  Marked weight loss.  Agitation.  Feelings of worthlessness.  Lack of interest in activity or food. Postpartum psychosis is a very concerning condition and can be dangerous. Fortunately, it is rare. Displaying any of the following symptoms is cause for immediate medical attention. Postpartum psychosis symptoms include:  Hallucinations and delusions.  Bizarre or disorganized behavior.  Confusion or disorientation. DIAGNOSIS  A diagnosis is made by an evaluation of your symptoms. There are no medical or lab tests that lead to a diagnosis, but there are various questionnaires that a caregiver may use to identify those with the baby blues, postpartum depression, or psychosis. Often times, a screening tool called the [place] Postnatal Depression Scale is used to diagnose depression in the postpartum period.  TREATMENT The baby blues usually goes  away on its own in 1 to 2 weeks. Social support is often all that is needed. You should be encouraged to get adequate sleep and rest. Occasionally, you may be given medicines to help you sleep.  Postpartum depression requires treatment as it can last several months or longer if it is not treated. Treatment may include individual or group therapy, medicine, or both to address any social, physiological, and psychological factors that may play a role in the depression. Regular exercise, a healthy diet, rest, and social support may also be strongly recommended.  Postpartum psychosis is more serious and needs treatment right away. Hospitalization is often needed. HOME CARE INSTRUCTIONS  Get as much rest as you can. Nap when the baby sleeps.  Exercise regularly. Some women find yoga and walking to be  beneficial.  Eat a balanced and nourishing diet.  Do little things that you enjoy. Have a cup of tea, take a bubble bath, read your favorite magazine, or listen to your favorite music.  Avoid alcohol.  Ask for help with household chores, cooking, grocery shopping, or running errands as needed. Do not try to do everything.  Talk to people close to you about how you are feeling. Get support from your partner, family members, friends, or other new moms.  Try to stay positive in how you think. Think about the things you are grateful for.  Do not spend a lot of time alone.  Only take medicine as directed by your caregiver.  Keep all your postpartum appointments.  Let your caregiver know if you have any concerns. SEEK MEDICAL CARE IF: You are having a reaction or problems with your medicine. SEEK IMMEDIATE MEDICAL CARE IF:  You have suicidal feelings.  You feel you may harm the baby or someone else. Document Released: 11/24/2003 Document Revised: 05/14/2011 Document Reviewed: 12/26/2010 [facility] Patient Information 2014 [place], [place].  Discharge to: home  Follow-up Information   Follow up  with Catha Brow., MD In 2 weeks. (For wound re-check)    Specialty:  Obstetrics and Gynecology   Contact information:   [address] [place] [address] [phone]        Charlane Ferretti 04/18/2013

## 2013-04-19 LAB — TYPE AND SCREEN
ABO/RH(D): A POS
Antibody Screen: NEGATIVE
Unit division: 0
Unit division: 0

## 2013-07-29 ENCOUNTER — Other Ambulatory Visit: Payer: Self-pay | Admitting: Obstetrics and Gynecology

## 2013-07-29 ENCOUNTER — Other Ambulatory Visit (HOSPITAL_COMMUNITY)
Admission: RE | Admit: 2013-07-29 | Discharge: 2013-07-29 | Disposition: A | Discharge status: Home / Self Care | Payer: 59 | Source: Ambulatory Visit | Attending: Obstetrics and Gynecology | Admitting: Obstetrics and Gynecology

## 2013-07-29 DIAGNOSIS — Z1151 Encounter for screening for human papillomavirus (HPV): Secondary | ICD-10-CM | POA: Insufficient documentation

## 2013-07-29 DIAGNOSIS — Z01419 Encounter for gynecological examination (general) (routine) without abnormal findings: Secondary | ICD-10-CM | POA: Insufficient documentation

## 2013-10-26 ENCOUNTER — Encounter (HOSPITAL_COMMUNITY)
Admission: RE | Admit: 2013-10-26 | Discharge: 2013-10-26 | Disposition: A | Discharge status: Home / Self Care | Payer: 59 | Source: Ambulatory Visit | Attending: Obstetrics and Gynecology | Admitting: Obstetrics and Gynecology

## 2013-10-26 ENCOUNTER — Encounter (HOSPITAL_COMMUNITY): Payer: Self-pay

## 2013-10-26 DIAGNOSIS — Z6839 Body mass index (BMI) 39.0-39.9, adult: Secondary | ICD-10-CM | POA: Diagnosis not present

## 2013-10-26 DIAGNOSIS — Z302 Encounter for sterilization: Secondary | ICD-10-CM | POA: Diagnosis present

## 2013-10-26 DIAGNOSIS — D649 Anemia, unspecified: Secondary | ICD-10-CM | POA: Diagnosis not present

## 2013-10-26 HISTORY — DX: Adverse effect of unspecified anesthetic, initial encounter: T41.45XA

## 2013-10-26 HISTORY — DX: Other complications of anesthesia, initial encounter: T88.59XA

## 2013-10-26 LAB — CBC
HCT: 35.6 % — ABNORMAL LOW (ref 36.0–46.0)
HEMOGLOBIN: 11.5 g/dL — AB (ref 12.0–15.0)
MCH: 26.5 pg (ref 26.0–34.0)
MCHC: 32.3 g/dL (ref 30.0–36.0)
MCV: 82 fL (ref 78.0–100.0)
Platelets: 340 10*3/uL (ref 150–400)
RBC: 4.34 MIL/uL (ref 3.87–5.11)
RDW: 13.7 % (ref 11.5–15.5)
WBC: 8.4 10*3/uL (ref 4.0–10.5)

## 2013-10-26 NOTE — Patient Instructions (Signed)
[place]  10/26/2013   Your procedure is scheduled on:  10/28/13  Enter through the Main Entrance of [facility] [place] up the phone at the desk and dial [phone].   Call this number if you have problems the morning of surgery: [phone]   Remember:   Do not eat food:After Midnight.  Do not drink clear liquids: After Midnight.  Take these medicines the morning of surgery with A SIP OF WATER: NA   Do not wear jewelry, make-up or nail polish.  Do not wear lotions, powders, or perfumes. You may wear deodorant.  Do not shave 48 hours prior to surgery.  Do not bring valuables to the hospital.  [facility] is not   responsible for any belongings or valuables brought to the hospital.  Contacts, dentures or bridgework may not be worn into surgery.  Leave suitcase in the car. After surgery it may be brought to your room.  For patients admitted to the hospital, checkout time is 11:00 AM the day of              discharge.   Patients discharged the day of surgery will not be allowed to drive             home.  Name and phone number of your driver: husband  Cedrick  Special Instructions:      Please read over the following fact sheets that you were given:   Surgical Site Infection Prevention

## 2013-10-27 ENCOUNTER — Encounter (HOSPITAL_COMMUNITY): Payer: Self-pay | Admitting: Pharmacist

## 2013-10-28 ENCOUNTER — Ambulatory Visit (HOSPITAL_COMMUNITY): Payer: 59 | Admitting: Anesthesiology

## 2013-10-28 ENCOUNTER — Other Ambulatory Visit: Payer: Self-pay | Admitting: Obstetrics and Gynecology

## 2013-10-28 ENCOUNTER — Ambulatory Visit (HOSPITAL_COMMUNITY)
Admission: RE | Admit: 2013-10-28 | Discharge: 2013-10-28 | Disposition: A | Discharge status: Home / Self Care | Payer: 59 | Source: Ambulatory Visit | Attending: Obstetrics and Gynecology | Admitting: Obstetrics and Gynecology

## 2013-10-28 ENCOUNTER — Encounter (HOSPITAL_COMMUNITY): Payer: 59 | Admitting: Anesthesiology

## 2013-10-28 ENCOUNTER — Encounter (HOSPITAL_COMMUNITY)
Admission: RE | Disposition: A | Discharge status: Home / Self Care | Payer: Self-pay | Source: Ambulatory Visit | Attending: Obstetrics and Gynecology

## 2013-10-28 ENCOUNTER — Encounter (HOSPITAL_COMMUNITY): Payer: Self-pay | Admitting: *Deleted

## 2013-10-28 DIAGNOSIS — D649 Anemia, unspecified: Secondary | ICD-10-CM | POA: Insufficient documentation

## 2013-10-28 DIAGNOSIS — Z6839 Body mass index (BMI) 39.0-39.9, adult: Secondary | ICD-10-CM | POA: Insufficient documentation

## 2013-10-28 DIAGNOSIS — Z9851 Tubal ligation status: Secondary | ICD-10-CM

## 2013-10-28 DIAGNOSIS — Z302 Encounter for sterilization: Secondary | ICD-10-CM | POA: Insufficient documentation

## 2013-10-28 HISTORY — PX: LAPAROSCOPIC TUBAL LIGATION: SHX1937

## 2013-10-28 LAB — PREGNANCY, URINE: Preg Test, Ur: NEGATIVE

## 2013-10-28 SURGERY — LIGATION, FALLOPIAN TUBE, LAPAROSCOPIC
Anesthesia: General | Site: Abdomen | Laterality: Bilateral

## 2013-10-28 MED ORDER — FENTANYL CITRATE 0.05 MG/ML IJ SOLN: 25.0000 ug | INTRAMUSCULAR | Status: DC | PRN

## 2013-10-28 MED ORDER — CEFAZOLIN SODIUM-DEXTROSE 2-3 GM-% IV SOLR
2.0000 g | INTRAVENOUS | Status: AC
  Administered 2013-10-28: 2 g via INTRAVENOUS

## 2013-10-28 MED ORDER — KETOROLAC TROMETHAMINE 30 MG/ML IJ SOLN: 15.0000 mg | Freq: Once | INTRAMUSCULAR | Status: DC | PRN

## 2013-10-28 MED ORDER — PROPOFOL 10 MG/ML IV BOLUS
INTRAVENOUS | Status: DC | PRN
  Administered 2013-10-28: 180 mg via INTRAVENOUS

## 2013-10-28 MED ORDER — GLYCOPYRROLATE 0.2 MG/ML IJ SOLN
INTRAMUSCULAR | Status: DC | PRN
  Administered 2013-10-28: 0.4 mg via INTRAVENOUS

## 2013-10-28 MED ORDER — ACETAMINOPHEN 160 MG/5ML PO SOLN
ORAL | Status: AC
  Administered 2013-10-28: 975 mg via ORAL
  Filled 2013-10-28: qty 40.6

## 2013-10-28 MED ORDER — GLYCOPYRROLATE 0.2 MG/ML IJ SOLN
INTRAMUSCULAR | Status: AC
  Filled 2013-10-28: qty 2

## 2013-10-28 MED ORDER — MEPERIDINE HCL 25 MG/ML IJ SOLN: 6.2500 mg | INTRAMUSCULAR | Status: DC | PRN

## 2013-10-28 MED ORDER — KETOROLAC TROMETHAMINE 30 MG/ML IJ SOLN
INTRAMUSCULAR | Status: AC
  Filled 2013-10-28: qty 1

## 2013-10-28 MED ORDER — IBUPROFEN 800 MG PO TABS: 800.0000 mg | ORAL_TABLET | Freq: Three times a day (TID) | ORAL | Status: DC | PRN

## 2013-10-28 MED ORDER — BUPIVACAINE HCL (PF) 0.25 % IJ SOLN
INTRAMUSCULAR | Status: AC
  Filled 2013-10-28: qty 30

## 2013-10-28 MED ORDER — LIDOCAINE HCL (CARDIAC) 20 MG/ML IV SOLN
INTRAVENOUS | Status: AC
  Filled 2013-10-28: qty 5

## 2013-10-28 MED ORDER — NEOSTIGMINE METHYLSULFATE 10 MG/10ML IV SOLN
INTRAVENOUS | Status: AC
  Filled 2013-10-28: qty 1

## 2013-10-28 MED ORDER — FENTANYL CITRATE 0.05 MG/ML IJ SOLN
INTRAMUSCULAR | Status: DC | PRN
  Administered 2013-10-28 (×3): 50 ug via INTRAVENOUS
  Administered 2013-10-28: 100 ug via INTRAVENOUS

## 2013-10-28 MED ORDER — DEXAMETHASONE SODIUM PHOSPHATE 4 MG/ML IJ SOLN
INTRAMUSCULAR | Status: AC
  Filled 2013-10-28: qty 1

## 2013-10-28 MED ORDER — DEXAMETHASONE SODIUM PHOSPHATE 10 MG/ML IJ SOLN
INTRAMUSCULAR | Status: DC | PRN
  Administered 2013-10-28: 4 mg via INTRAVENOUS

## 2013-10-28 MED ORDER — NEOSTIGMINE METHYLSULFATE 10 MG/10ML IV SOLN
INTRAVENOUS | Status: DC | PRN
  Administered 2013-10-28: 2 mg via INTRAVENOUS

## 2013-10-28 MED ORDER — BUPIVACAINE HCL (PF) 0.25 % IJ SOLN
INTRAMUSCULAR | Status: DC | PRN
  Administered 2013-10-28: 20 mL
  Administered 2013-10-28: 10 mL

## 2013-10-28 MED ORDER — MIDAZOLAM HCL 2 MG/2ML IJ SOLN
INTRAMUSCULAR | Status: AC
  Filled 2013-10-28: qty 2

## 2013-10-28 MED ORDER — CEFAZOLIN SODIUM-DEXTROSE 2-3 GM-% IV SOLR
INTRAVENOUS | Status: AC
  Filled 2013-10-28: qty 50

## 2013-10-28 MED ORDER — PROPOFOL 10 MG/ML IV EMUL
INTRAVENOUS | Status: AC
  Filled 2013-10-28: qty 20

## 2013-10-28 MED ORDER — LIDOCAINE HCL (CARDIAC) 20 MG/ML IV SOLN
INTRAVENOUS | Status: DC | PRN
  Administered 2013-10-28: 50 mg via INTRAVENOUS

## 2013-10-28 MED ORDER — LACTATED RINGERS IV SOLN
INTRAVENOUS | Status: DC
  Administered 2013-10-28 (×2): via INTRAVENOUS

## 2013-10-28 MED ORDER — ACETAMINOPHEN 160 MG/5ML PO SOLN
975.0000 mg | Freq: Once | ORAL | Status: AC
  Administered 2013-10-28: 975 mg via ORAL

## 2013-10-28 MED ORDER — FENTANYL CITRATE 0.05 MG/ML IJ SOLN
INTRAMUSCULAR | Status: AC
  Filled 2013-10-28: qty 5

## 2013-10-28 MED ORDER — PROPOFOL 10 MG/ML IV EMUL
INTRAVENOUS | Status: AC
Start: 2013-10-28 — End: 2013-10-28
  Filled 2013-10-28: qty 20

## 2013-10-28 MED ORDER — KETOROLAC TROMETHAMINE 30 MG/ML IJ SOLN
INTRAMUSCULAR | Status: DC | PRN
  Administered 2013-10-28: 30 mg via INTRAVENOUS

## 2013-10-28 MED ORDER — ONDANSETRON HCL 4 MG/2ML IJ SOLN
INTRAMUSCULAR | Status: AC
  Filled 2013-10-28: qty 2

## 2013-10-28 MED ORDER — SCOPOLAMINE 1 MG/3DAYS TD PT72
MEDICATED_PATCH | TRANSDERMAL | Status: AC
  Administered 2013-10-28: 1.5 mg
  Filled 2013-10-28: qty 1

## 2013-10-28 MED ORDER — ONDANSETRON HCL 4 MG/2ML IJ SOLN
INTRAMUSCULAR | Status: DC | PRN
  Administered 2013-10-28: 4 mg via INTRAVENOUS

## 2013-10-28 MED ORDER — ROCURONIUM BROMIDE 100 MG/10ML IV SOLN
INTRAVENOUS | Status: DC | PRN
  Administered 2013-10-28: 40 mg via INTRAVENOUS

## 2013-10-28 MED ORDER — PROMETHAZINE HCL 25 MG/ML IJ SOLN: 6.2500 mg | INTRAMUSCULAR | Status: DC | PRN

## 2013-10-28 MED ORDER — HYDROCODONE-ACETAMINOPHEN 5-325 MG PO TABS: 1.0000 | ORAL_TABLET | Freq: Four times a day (QID) | ORAL | Status: DC | PRN

## 2013-10-28 MED ORDER — MIDAZOLAM HCL 2 MG/2ML IJ SOLN
INTRAMUSCULAR | Status: DC | PRN
  Administered 2013-10-28: 2 mg via INTRAVENOUS

## 2013-10-28 MED ORDER — DEXAMETHASONE SODIUM PHOSPHATE 10 MG/ML IJ SOLN
INTRAMUSCULAR | Status: AC
  Filled 2013-10-28: qty 1

## 2013-10-28 MED ORDER — ROCURONIUM BROMIDE 100 MG/10ML IV SOLN
INTRAVENOUS | Status: AC
  Filled 2013-10-28: qty 1

## 2013-10-28 SURGICAL SUPPLY — 24 items
APPLICATOR COTTON TIP 6IN STRL (MISCELLANEOUS) ×2 IMPLANT
CABLE HIGH FREQUENCY MONO STRZ (ELECTRODE) ×2 IMPLANT
CATH ROBINSON RED A/P 16FR (CATHETERS) ×2 IMPLANT
CLOTH BEACON ORANGE TIMEOUT ST (SAFETY) ×2 IMPLANT
DERMABOND ADVANCED (GAUZE/BANDAGES/DRESSINGS) ×1
DERMABOND ADVANCED .7 DNX12 (GAUZE/BANDAGES/DRESSINGS) ×1 IMPLANT
DILATOR CANAL MILEX (MISCELLANEOUS) IMPLANT
DRSG COVADERM PLUS 2X2 (GAUZE/BANDAGES/DRESSINGS) IMPLANT
DRSG OPSITE POSTOP 3X4 (GAUZE/BANDAGES/DRESSINGS) ×2 IMPLANT
ELECT REM PT RETURN 9FT ADLT (ELECTROSURGICAL) ×2
ELECTRODE REM PT RTRN 9FT ADLT (ELECTROSURGICAL) ×1 IMPLANT
FORCEPS CUTTING 45CM 5MM (CUTTING FORCEPS) IMPLANT
GLOVE BIOGEL M 6.5 STRL (GLOVE) ×4 IMPLANT
GLOVE BIOGEL PI IND STRL 6.5 (GLOVE) ×1 IMPLANT
GLOVE BIOGEL PI INDICATOR 6.5 (GLOVE) ×1
GOWN STRL REUS W/TWL LRG LVL3 (GOWN DISPOSABLE) ×4 IMPLANT
PACK LAPAROSCOPY BASIN (CUSTOM PROCEDURE TRAY) ×2 IMPLANT
SOLUTION ELECTROLUBE (MISCELLANEOUS) ×2 IMPLANT
SUT VICRYL 0 UR6 27IN ABS (SUTURE) ×2 IMPLANT
SUT VICRYL 4-0 PS2 18IN ABS (SUTURE) ×2 IMPLANT
TOWEL OR 17X24 6PK STRL BLUE (TOWEL DISPOSABLE) ×4 IMPLANT
TROCAR XCEL NON-BLD 11X100MML (ENDOMECHANICALS) ×2 IMPLANT
WARMER LAPAROSCOPE (MISCELLANEOUS) ×2 IMPLANT
WATER STERILE IRR 1000ML POUR (IV SOLUTION) IMPLANT

## 2013-10-28 NOTE — Anesthesia Preprocedure Evaluation (Signed)
Anesthesia Evaluation  Patient identified by MRN, date of birth, ID band Patient awake    Reviewed: Allergy & Precautions, H&P , NPO status , Patient's Chart, lab work & pertinent test results, reviewed documented beta blocker date and time   History of Anesthesia Complications Negative for: history of anesthetic complications  Airway Mallampati: II TM Distance: >3 FB Neck ROM: full  Mouth opening: Limited Mouth Opening  Dental  (+) Teeth Intact   Pulmonary neg pulmonary ROS,  breath sounds clear to auscultation  Pulmonary exam normal       Cardiovascular Exercise Tolerance: Good negative cardio ROS  Rhythm:regular Rate:Normal     Neuro/Psych negative neurological ROS  negative psych ROS   GI/Hepatic negative GI ROS, Neg liver ROS,   Endo/Other  Morbid obesity  Renal/GU negative Renal ROS  negative genitourinary   Musculoskeletal   Abdominal   Peds  Hematology  (+) anemia , Protein S deficiency - never had a clot (family members have), took lovenox during pregnancy   Anesthesia Other Findings   Reproductive/Obstetrics negative OB ROS                           Anesthesia Physical Anesthesia Plan  ASA: III  Anesthesia Plan: General ETT   Post-op Pain Management:    Induction:   Airway Management Planned:   Additional Equipment:   Intra-op Plan:   Post-operative Plan:   Informed Consent: I have reviewed the patients History and Physical, chart, labs and discussed the procedure including the risks, benefits and alternatives for the proposed anesthesia with the patient or authorized representative who has indicated his/her understanding and acceptance.   Dental Advisory Given  Plan Discussed with: CRNA and Surgeon  Anesthesia Plan Comments:         Anesthesia Quick Evaluation

## 2013-10-28 NOTE — H&P (Signed)
Chief Complaint(s):   consultation/ history and physical for tubal ligation   HPI:  General 33 y/o presents to discus tubal ligation. she is interested in a permanent form of contraception.  Current Medication:  Taking  Prenatal Vitamins 0.8 MG Tablet     Medication List reviewed and reconciled with the patient   Medical History:   questionable protein S deficiency- NO HORMONES     elevated TE disease risk     lovenox during pregnancy     anemia   Allergies/Intolerance:   N.K.D.A.   Gyn History:   Sexual activity currently sexually active. Periods : every month. LMP 09/14/13. Birth control condoms. Last pap smear date 07/29/13. Denies Last mammogram date. Denies Abnormal pap smear. Denies STD. Menarche 7.   OB History:   Number of pregnancies 2. Pregnancy # 1 04/26/2001 , live birth, C-section delivery, boy. Pregnancy # 2 live birth, C-section, girl,.   Surgical History:   c-section 04/26/2001     c-section 04/2013   Hospitalization:   childbirth/cesarean section 04/26/2001     childbirth 04/2013   Family History:   Father: deceased, thrombolitic disease    Mother: alive, hypertension    Paternal [place] Father: alive, unknown    Paternal [place] Mother: alive, obesity, diabetes    Maternal Grand Father: alive, stroke, hypertension    Maternal Grand Mother: deceased, ovarian cancer    Brother 1: deceased, died age 77    Brother2: alive    Sister 53: alive, thrombolitic disease    Sister 2: alive, fibroids    Sister 3: alive    2 brother(s) , 3 sister(s) . 1 son(s) .    grandmother-CVA, dibetes mellitus; granfather-hypertension, CVA.  Social History:  General Tobacco use cigarettes: Never smoked, Tobacco history last updated 10/06/2013.  no Smoking.  no Alcohol.  Caffeine: yes, tea.  no Recreational drug use.  Occupation: employed, [employer].  Marital Status: married.  Children: 1, son (s) 1, , daughter (s).  ROS: Negative except as stated in HPI.    Objective:  Vitals:  Wt 198, Wt change -6 lb, Ht 59, BMI 39.99, Pulse sitting 76, BP sitting 99/59  Past Results:  Examination:  General Examination alert, oriented, NAD" label="GENERAL APPEARANCE" categoryPropId="[id number]" examid="[id number]"GENERAL APPEARANCE alert, oriented, NAD.  clear to auscultation bilaterally" label="LUNGS:" categoryPropId="87" examid="[id number]"LUNGS: clear to auscultation bilaterally.  regular rate and rhythm" label="HEART:" categoryPropId="86" examid="[id number]"HEART: regular rate and rhythm.  soft, non-tender/non-distended, bowel sounds present" label="ABDOMEN:" categoryPropId="88" examid="[id number]"ABDOMEN: soft, non-tender/non-distended, bowel sounds present.  normal external genitalia, labia - unremarkable, vagina - pink moist mucosa, no lesions or abnormal discharge, cervix - no discharge or lesions or CMT, adnexa - no masses or tenderness, uterus - nontender and normal size on palpation" label="FEMALE GENITOURINARY:" categoryPropId="[id number]" examid="[id number]"FEMALE GENITOURINARY: normal external genitalia, labia - unremarkable, vagina - pink moist mucosa, no lesions or abnormal discharge, cervix - no discharge or lesions or CMT, adnexa - no masses or tenderness, uterus - nontender and normal size on palpation.  no edema present" label="EXTREMITIES:" categoryPropId="89" examid="[id number]"EXTREMITIES: no edema present.  Physical Examination:   Assessment:  Assessment:  Contraceptive management - V25.9 (Primary)     Plan:  Treatment:  Contraceptive management  Notes: all options of contraception reviewed with the patient. we discussed r/b/a of btl including but not limited to infection / bleeding damaage to bowel bladder surrounding organs with the need for further surgery. Discussed with pt 1 % r/o failure with 50 % r/o ectopic if failure occurs which can be life threatening. She voiced understanding  and desires to proceed with Laparoscopy BTL....  Referral IF:[facility] -  Gynecology  Reason:please check insurance coverage and schedule Laproscopic btl  Procedures:  Immunizations:  Therapeutic Injections:  Diagnostic Imaging:

## 2013-10-28 NOTE — Transfer of Care (Signed)
Immediate Anesthesia Transfer of Care Note  Patient: Brenda Rivers  Procedure(s) Performed: Procedure(s): LAPAROSCOPIC TUBAL LIGATION (Bilateral)  Patient Location: PACU  Anesthesia Type:General  Level of Consciousness: awake, alert  and oriented  Airway & Oxygen Therapy: Patient Spontanous Breathing and Patient connected to nasal cannula oxygen  Post-op Assessment: Report given to PACU RN and Post -op Vital signs reviewed and stable  Post vital signs: Reviewed and stable  Complications: No apparent anesthesia complications

## 2013-10-28 NOTE — Anesthesia Postprocedure Evaluation (Signed)
  Anesthesia Post-op Note  Anesthesia Post Note  Patient: Brenda Rivers  Procedure(s) Performed: Procedure(s) (LRB): LAPAROSCOPIC TUBAL LIGATION (Bilateral)  Anesthesia type: General  Patient location: PACU  Post pain: Pain level controlled  Post assessment: Post-op Vital signs reviewed  Last Vitals:  Filed Vitals:   10/28/13 1345  BP: 112/69  Pulse: 70  Temp:   Resp: 16    Post vital signs: Reviewed  Level of consciousness: sedated  Complications: No apparent anesthesia complications

## 2013-10-28 NOTE — Discharge Instructions (Signed)

## 2013-10-28 NOTE — Anesthesia Procedure Notes (Signed)
Procedure Name: Intubation Date/Time: 10/28/2013 12:08 PM Performed by: Jonna Munro Pre-anesthesia Checklist: Suction available, Emergency Drugs available, Timeout performed, Patient identified and Patient being monitored Patient Re-evaluated:Patient Re-evaluated prior to inductionOxygen Delivery Method: Circle system utilized Preoxygenation: Pre-oxygenation with 100% oxygen Intubation Type: IV induction Ventilation: Mask ventilation without difficulty Laryngoscope Size: Mac and 3 Grade View: Grade II Tube type: Oral Tube size: 7.0 mm Number of attempts: 1 Airway Equipment and Method: Stylet Placement Confirmation: ETT inserted through vocal cords under direct vision,  positive ETCO2 and breath sounds checked- equal and bilateral Secured at: 22 cm Tube secured with: Tape Dental Injury: Teeth and Oropharynx as per pre-operative assessment

## 2013-10-28 NOTE — Op Note (Signed)
10/28/2013  1:08 PM  PATIENT:  Brenda Rivers  33 y.o. female  PRE-OPERATIVE DIAGNOSIS:  Contraceptive Management  POST-OPERATIVE DIAGNOSIS:  Contraceptive Management  PROCEDURE:  Procedure(s): LAPAROSCOPIC TUBAL LIGATION (Bilateral)  SURGEON:  Surgeon(s) and Role:    * Chonda Baney J. Landry Mellow, MD - Primary  PHYSICIAN ASSISTANT:   ASSISTANTS: none   ANESTHESIA:   general  EBL:  Total I/O In: 1000 [I.V.:1000] Out: 210 [Urine:200; Blood:10]  BLOOD ADMINISTERED:none  DRAINS: none   LOCAL MEDICATIONS USED:  MARCAINE     SPECIMEN:  No Specimen  DISPOSITION OF SPECIMEN:  N/A  COUNTS:  YES  TOURNIQUET:  * No tourniquets in log *  DICTATION: .Dragon Dictation  PLAN OF CARE: Discharge to home after PACU  PATIENT DISPOSITION:  PACU - hemodynamically stable.   Delay start of Pharmacological VTE agent (>24hrs) due to surgical blood loss or risk of bleeding: not applicable  Procedure: The patient was taken to the operating room where she was placed under general anesthesia. She is placed in the dorsolithotomy position. She was prepped and draped in the usual sterile fashion. Speculum was placed into the vaginal vault. In the anterior lip of the cervix grasped with a single-tooth tenaculum. A uterine manipulator was placed without difficulty. The single-tooth tenaculum was  Removed. The speculum was removed.  Attention was turned to the umbilicus which was injected with 10 cc of quarter percent Marcaine. A 11 mm incision was made with the scalpel. A 11 mm trocar was placed under direct visualization. Entry into the peritoneum was visualized. The pneumoperitoneum was achieved with CO2 gas.  Her  fallopian tubes and ovaries appeared normal.  Operative scope was inserted and the right fallopian tube was identified and followed out to the fimbriated end. The a 2-3 cm portion of the right fallopian tube was cauterized with the Gyrus.  This was repeated on the left fallopian tube The Gyrus was  removed. A laparoscopic needle was inserted and 10 cc of quarter percent Marcaine was placed along the ampullary portion of the tubes  bilaterally. Pneumoperitoneum was released. The umbilical trocar was removed under direct visualization. The fascia was closed with 0 Vicryl. The skin was closed with 4-0 Vicryl. The dermabond placed a over the skin incision.  Patient was awakened from anesthesia taken to the recovery room awake and in stable condition.

## 2013-10-29 ENCOUNTER — Encounter (HOSPITAL_COMMUNITY): Payer: Self-pay | Admitting: Obstetrics and Gynecology

## 2013-10-30 NOTE — Addendum Note (Signed)
Addendum created 10/30/13 1053 by Assunta Gambles, MD   Modules edited: Anesthesia Attestations

## 2014-01-04 ENCOUNTER — Encounter (HOSPITAL_COMMUNITY): Payer: Self-pay | Admitting: Obstetrics and Gynecology

## 2014-08-03 ENCOUNTER — Other Ambulatory Visit: Payer: Self-pay | Admitting: Obstetrics and Gynecology

## 2014-08-03 ENCOUNTER — Other Ambulatory Visit (HOSPITAL_COMMUNITY)
Admission: RE | Admit: 2014-08-03 | Discharge: 2014-08-03 | Disposition: A | Discharge status: Home / Self Care | Payer: 59 | Source: Ambulatory Visit | Attending: Obstetrics and Gynecology | Admitting: Obstetrics and Gynecology

## 2014-08-03 DIAGNOSIS — Z01419 Encounter for gynecological examination (general) (routine) without abnormal findings: Secondary | ICD-10-CM | POA: Diagnosis present

## 2014-08-04 LAB — CYTOLOGY - PAP

## 2018-11-25 ENCOUNTER — Other Ambulatory Visit: Payer: Self-pay | Admitting: Obstetrics and Gynecology

## 2018-11-25 ENCOUNTER — Other Ambulatory Visit (HOSPITAL_COMMUNITY)
Admission: RE | Admit: 2018-11-25 | Discharge: 2018-11-25 | Disposition: A | Discharge status: Home / Self Care | Payer: Managed Care, Other (non HMO) | Source: Ambulatory Visit | Attending: Obstetrics and Gynecology | Admitting: Obstetrics and Gynecology

## 2018-11-25 DIAGNOSIS — Z01419 Encounter for gynecological examination (general) (routine) without abnormal findings: Secondary | ICD-10-CM | POA: Insufficient documentation

## 2018-12-04 LAB — CYTOLOGY - PAP
Diagnosis: NEGATIVE
High risk HPV: NEGATIVE
Molecular Disclaimer: 56
Molecular Disclaimer: NORMAL

## 2019-02-16 ENCOUNTER — Ambulatory Visit
Admission: EM | Admit: 2019-02-16 | Discharge: 2019-02-16 | Disposition: A | Discharge status: Home / Self Care | Payer: Managed Care, Other (non HMO)

## 2019-02-16 ENCOUNTER — Other Ambulatory Visit: Payer: Self-pay

## 2019-02-16 ENCOUNTER — Encounter: Payer: Self-pay | Admitting: Emergency Medicine

## 2019-02-16 DIAGNOSIS — M79662 Pain in left lower leg: Secondary | ICD-10-CM

## 2019-02-16 DIAGNOSIS — M7989 Other specified soft tissue disorders: Secondary | ICD-10-CM | POA: Diagnosis not present

## 2019-02-16 NOTE — ED Provider Notes (Signed)
[facility]    CSN: [id number] Arrival date & time: 02/16/19  1832      History   Chief Complaint Chief Complaint  Patient presents with  . Leg Swelling    HPI Brenda Rivers is a 38 y.o. female with history of protein S deficiency presenting for persistent left leg pain after jumping up with her daughter 3 days ago: Denies ankle/foot/leg injury.  No pain immediately after jumping rope.  Pain is throbbing, worse when sitting or laying for prolonged period.  Denies pain at rest.  Patient has been able to ambulate without antalgia.  Patient denies history of blood clot, anticoagulant/blood thinner use.  Past Medical History:  Diagnosis Date  . Complication of anesthesia    epidural x2 and "still felt pain"  . Protein S deficiency [facility])     Patient Active Problem List   Diagnosis Date Noted  . Status post repeat low transverse cesarean section 04/17/2013  . Post-dates pregnancy 04/15/2013    Past Surgical History:  Procedure Laterality Date  . CESAREAN SECTION    . CESAREAN SECTION N/A 04/16/2013   Procedure: CESAREAN SECTION Baby Girl @0853  Apgar 8/9;  Surgeon: Annalee Genta, DO;  Location: [place] [facility] [facility];  Service: Obstetrics;  Laterality: N/A;  Arrest to descend 41Weeks  . LAPAROSCOPIC TUBAL LIGATION Bilateral 10/28/2013   Procedure: LAPAROSCOPIC TUBAL LIGATION;  Surgeon: Maeola Sarah. Landry Mellow, MD;  Location: [facility];  Service: Gynecology;  Laterality: Bilateral;    OB History    Gravida  2   Para  2   Term  2   Preterm  0   AB  0   Living  2     SAB  0   TAB  0   Ectopic  0   Multiple  0   Live Births  2            Home Medications    Prior to Admission medications   Medication Sig Start Date End Date Taking? Authorizing Provider  ferrous sulfate 325 (65 FE) MG tablet Take 325 mg by mouth daily with breakfast.   Yes [provider]  ibuprofen (ADVIL,MOTRIN) 800 MG tablet Take 1 tablet (800 mg total) by mouth every 8 (eight) hours as  needed. 10/28/13   Christophe Louis, MD    Family History Family History  Problem Relation Age of Onset  . Diabetes Mother   . Protein C deficiency Father   . Hypertension Father     Social History Social History   Tobacco Use  . Smoking status: Never Smoker  . Smokeless tobacco: Never Used  Substance Use Topics  . Alcohol use: No  . Drug use: No     Allergies   Patient has no known allergies.   Review of Systems Review of Systems  Constitutional: Negative for fatigue and fever.  HENT: Negative for ear pain, sinus pain, sore throat and voice change.   Eyes: Negative for pain, redness and visual disturbance.  Respiratory: Negative for cough and shortness of breath.   Cardiovascular: Negative for chest pain and palpitations.  Gastrointestinal: Negative for abdominal pain, diarrhea and vomiting.  Musculoskeletal: Negative for back pain and gait problem.       Positive for left lower leg pain, swelling  Skin: Negative for rash and wound.  Neurological: Negative for syncope and headaches.     Physical Exam Triage Vital Signs ED Triage Vitals  Enc Vitals Group     BP 02/16/19 1841 (!) 145/91  Pulse Rate 02/16/19 1841 85     Resp 02/16/19 1841 16     Temp 02/16/19 1841 (!) 97.4 F (36.3 C)     Temp Source 02/16/19 1841 Temporal     SpO2 02/16/19 1841 97 %     Weight --      Height --      Head Circumference --      Peak Flow --      Pain Score 02/16/19 1842 0     Pain Loc --      Pain Edu? --      Excl. in [place]? --    No data found.  Updated Vital Signs BP 134/88 (BP Location: Left Arm)   Pulse 85   Temp (!) 97.4 F (36.3 C) (Temporal)   Resp 16   LMP 01/14/2019   SpO2 97%   Visual Acuity Right Eye Distance:   Left Eye Distance:   Bilateral Distance:    Right Eye Near:   Left Eye Near:    Bilateral Near:     Physical Exam Constitutional:      General: She is not in acute distress. HENT:     Head: Normocephalic and atraumatic.  Eyes:      General: No scleral icterus.    Pupils: Pupils are equal, round, and reactive to light.  Cardiovascular:     Rate and Rhythm: Normal rate.     Pulses: Normal pulses.  Pulmonary:     Effort: Pulmonary effort is normal.  Musculoskeletal:     Comments: Left lower leg without significant swelling as compared to right.  Patient does have 2+ pitting edema in left lower leg, trace in the right.  Leg is largely nontender, no cords palpated, negative Homans' sign.  Skin:    Capillary Refill: Capillary refill takes less than 2 seconds.     Coloration: Skin is not jaundiced or pale.     Findings: No bruising, erythema or rash.     Comments: Lower legs without dusky appearance, cold/heat bilaterally  Neurological:     General: No focal deficit present.     Mental Status: She is alert and oriented to person, place, and time.      UC Treatments / Results  Labs (all labs ordered are listed, but only abnormal results are displayed) Labs Reviewed - No data to display  EKG   Radiology No results found.  Procedures Procedures (including critical care time)  Medications Ordered in UC Medications - No data to display  Initial Impression / Assessment and Plan / UC Course  I have reviewed the triage vital signs and the nursing notes.  Pertinent labs & imaging results that were available during my care of the patient were reviewed by me and considered in my medical decision making (see chart for details).     Patient afebrile, nontoxic, hemodynamically stable.  Patient's protein S deficiency does increase patient's risk of DVT.  Low concern for fracture at this time given lack of,/pain, ability to bear weight.  Most concerning finding is pitting edema on left.  No chest pain, shortness of breath, tachycardia, hypoxemia: We will order outpatient DVT study to be done tomorrow.  Patient to practice NSAID use, warm compresses in the interim.  ER return precautions discussed, patient verbalized  understanding and is agreeable to plan. Final Clinical Impressions(s) / UC Diagnoses   Final diagnoses:  Pain and swelling of left lower leg     Discharge Instructions     Vascular  ultrasound department's phone number is: [phone].  You should receive a call in the morning: If no phone call by 9 AM, please call the number listed above regarding scheduling. In the interim, please go to ER for worsening pain/swelling, chest pain, shortness of breath, rapid heart rate.  Recommend taking ibuprofen, doing hot compress/heating pad plus/minus Epson salt soak for additional treatment.    ED Prescriptions    None     PDMP not reviewed this encounter.   Neldon Mc [place], [place] 02/16/19 1902

## 2019-02-16 NOTE — ED Triage Notes (Addendum)
Pt presents to [facility] for assessmnt of left leg pain after she was jumping rope with her 40 year old daughter 3 days ago.  States hx of "protein insufficiency" and wanted to be sure nothing more serious is going on.  Pt denies pain except when it's up for a while, and then throbbing starts.

## 2019-02-16 NOTE — ED Notes (Signed)
Patient able to ambulate independently  

## 2019-02-16 NOTE — Discharge Instructions (Addendum)
Vascular ultrasound department's phone number is: [phone].  You should receive a call in the morning: If no phone call by 9 AM, please call the number listed above regarding scheduling. In the interim, please go to ER for worsening pain/swelling, chest pain, shortness of breath, rapid heart rate.  Recommend taking ibuprofen, doing hot compress/heating pad plus/minus Epson salt soak for additional treatment.

## 2019-02-17 ENCOUNTER — Telehealth: Payer: Self-pay | Admitting: Emergency Medicine

## 2019-02-17 ENCOUNTER — Ambulatory Visit (HOSPITAL_COMMUNITY)
Admission: RE | Admit: 2019-02-17 | Discharge: 2019-02-17 | Disposition: A | Discharge status: Home / Self Care | Payer: Managed Care, Other (non HMO) | Source: Ambulatory Visit | Attending: Emergency Medicine | Admitting: Emergency Medicine

## 2019-02-17 DIAGNOSIS — M79609 Pain in unspecified limb: Secondary | ICD-10-CM

## 2019-02-17 DIAGNOSIS — M79605 Pain in left leg: Secondary | ICD-10-CM | POA: Diagnosis present

## 2019-02-17 DIAGNOSIS — M7989 Other specified soft tissue disorders: Secondary | ICD-10-CM

## 2019-02-17 DIAGNOSIS — D6859 Other primary thrombophilia: Secondary | ICD-10-CM | POA: Diagnosis not present

## 2019-02-17 MED ORDER — APIXABAN 5 MG PO TABS: ORAL_TABLET | ORAL | 0 refills | Status: DC

## 2019-02-17 NOTE — Telephone Encounter (Signed)
Called patient to follow up on [place] order

## 2019-02-17 NOTE — Telephone Encounter (Signed)
Order deleted overnight.  Reordered venous Doppler, confirmed with vascular that order was received.

## 2019-02-17 NOTE — Telephone Encounter (Signed)
-----   Message from [place], [place] sent at 02/16/2019  7:00 PM EST ----- Regarding: dvt? call in am

## 2019-02-17 NOTE — Telephone Encounter (Signed)
Received call from vascular US who spoke to [place], APP about positive DVT finding.  [place] to place order for eliquis and gave verbal instruction for patient to follow-up with PCP, inform them of new medication, and schedule 2 week follow up.

## 2019-02-17 NOTE — Progress Notes (Signed)
Left lower extremity venous duplex completed. Refer to "CV Proc" under chart review to view preliminary results.  Critical results discussed with Quincy Sheehan, PA-C.  02/17/2019 11:47 AM Kelby Aline., MHA, RVT, RDCS, RDMS

## 2019-02-17 NOTE — Telephone Encounter (Signed)
Spoke with Sharyn Lull from vascular US who reported "acute deep vein thrombosis involving a single left posterior tibial vein".  This provider reviewed findings with Dr. Janett Labella who agrees with A/P: Patient to start Eliquis, follow-up closely with PCP.  Relayed plan to Sharyn Lull who verbalized understanding: States patient will call her primary care upon discharge from ultrasound today for follow-up in 2 weeks.  ER return precautions discussed by Sharyn Lull with patient prior to discharge as per protocol.  Rx sent.

## 2019-03-30 NOTE — Progress Notes (Signed)
[place] CONSULT NOTE  Patient Care Team: Patient, No Pcp Per as PCP - General (General Practice)  CHIEF COMPLAINTS/PURPOSE OF CONSULTATION:  Newly diagnosed DVT  HISTORY OF PRESENTING ILLNESS:  Brenda Rivers 39 y.o. female is here because of recent diagnosis of left lower extremity DVT. She has a history of protein S deficiency diagnosed during pregnancy and was previously on Lovenox. She also has a history of prothrombin gene mutation and a recent DVT, diagnosed after she has swelling in her left lower extremity. [place] on 02/17/19 showed an acute DVT, and is currently on anticoagulation with Eliquis. She was taking hormonal therapy for her menorrhagia, but it was discontinued on 03/16/19 due to her DVT. She presents to the clinic today for initial evaluation and to discuss ongoing anticoagulation and hormonal therapy.   I reviewed her records extensively and collaborated the history with the patient.  MEDICAL HISTORY:  Past Medical History:  Diagnosis Date  . Complication of anesthesia    epidural x2 and "still felt pain"  . Protein S deficiency ([place])     SURGICAL HISTORY: Past Surgical History:  Procedure Laterality Date  . CESAREAN SECTION    . CESAREAN SECTION N/A 04/16/2013   Procedure: CESAREAN SECTION Baby Girl @0853  Apgar 8/9;  Surgeon: Annalee Genta, DO;  Location: [facility];  Service: Obstetrics;  Laterality: N/A;  Arrest to descend 41Weeks  . LAPAROSCOPIC TUBAL LIGATION Bilateral 10/28/2013   Procedure: LAPAROSCOPIC TUBAL LIGATION;  Surgeon: Maeola Sarah. Landry Mellow, MD;  Location: [facility];  Service: Gynecology;  Laterality: Bilateral;    SOCIAL HISTORY: Social History   Socioeconomic History  . Marital status: Married    Spouse name: Not on file  . Number of children: Not on file  . Years of education: Not on file  . Highest education level: Not on file  Occupational History  . Not on file  Tobacco Use  . Smoking status: Never Smoker  . Smokeless tobacco:  Never Used  Substance and Sexual Activity  . Alcohol use: No  . Drug use: No  . Sexual activity: Yes    Birth control/protection: None  Other Topics Concern  . Not on file  Social History Narrative  . Not on file   Social Determinants of Health   Financial Resource Strain:   . Difficulty of Paying Living Expenses: Not on file  Food Insecurity:   . Worried About Charity fundraiser in the Last Year: Not on file  . Ran Out of Food in the Last Year: Not on file  Transportation Needs:   . Lack of Transportation (Medical): Not on file  . Lack of Transportation (Non-Medical): Not on file  Physical Activity:   . Days of Exercise per Week: Not on file  . Minutes of Exercise per Session: Not on file  Stress:   . Feeling of Stress : Not on file  Social Connections:   . Frequency of Communication with Friends and Family: Not on file  . Frequency of Social Gatherings with Friends and Family: Not on file  . Attends Religious Services: Not on file  . Active Member of Clubs or Organizations: Not on file  . Attends Archivist Meetings: Not on file  . Marital Status: Not on file  Intimate Partner Violence:   . Fear of Current or Ex-Partner: Not on file  . Emotionally Abused: Not on file  . Physically Abused: Not on file  . Sexually Abused: Not on file  FAMILY HISTORY: Family History  Problem Relation Age of Onset  . Diabetes Mother   . Protein C deficiency Father   . Hypertension Father     ALLERGIES:  has No Known Allergies.  MEDICATIONS:  Current Outpatient Medications  Medication Sig Dispense Refill  . apixaban (ELIQUIS) 5 MG TABS tablet Take 2 tablets (10mg ) twice daily for 7 days, then 1 tablet (5mg ) twice daily 60 tablet 0  . ferrous sulfate 325 (65 FE) MG tablet Take 325 mg by mouth daily with breakfast.    . ibuprofen (ADVIL,MOTRIN) 800 MG tablet Take 1 tablet (800 mg total) by mouth every 8 (eight) hours as needed. 30 tablet 0   No current  facility-administered medications for this visit.    REVIEW OF SYSTEMS:   Constitutional: Denies fevers, chills or abnormal night sweats Eyes: Denies blurriness of vision, double vision or watery eyes Ears, nose, mouth, throat, and face: Denies mucositis or sore throat Respiratory: Denies cough, dyspnea or wheezes Cardiovascular: Denies palpitation, chest discomfort or lower extremity swelling Gastrointestinal:  Denies nausea, heartburn or change in bowel habits Skin: Denies abnormal skin rashes Lymphatics: Denies new lymphadenopathy or easy bruising Neurological:Denies numbness, tingling or new weaknesses Behavioral/Psych: Mood is stable, no new changes  Breast: Denies any palpable lumps or discharge All other systems were reviewed with the patient and are negative.  PHYSICAL EXAMINATION: ECOG PERFORMANCE STATUS: 1 - Symptomatic but completely ambulatory  There were no vitals filed for this visit. There were no vitals filed for this visit.  GENERAL:alert, no distress and comfortable SKIN: skin color, texture, turgor are normal, no rashes or significant lesions EYES: normal, conjunctiva are pink and non-injected, sclera clear OROPHARYNX:no exudate, no erythema and lips, buccal mucosa, and tongue normal  NECK: supple, thyroid normal size, non-tender, without nodularity LYMPH:  no palpable lymphadenopathy in the cervical, axillary or inguinal LUNGS: clear to auscultation and percussion with normal breathing effort HEART: regular rate & rhythm and no murmurs and no lower extremity edema ABDOMEN:abdomen soft, non-tender and normal bowel sounds Musculoskeletal:no cyanosis of digits and no clubbing  PSYCH: alert & oriented x 3 with fluent speech NEURO: no focal motor/sensory deficits  LABORATORY DATA:  I have reviewed the data as listed Lab Results  Component Value Date   WBC 8.4 10/26/2013   HGB 11.5 (L) 10/26/2013   HCT 35.6 (L) 10/26/2013   MCV 82.0 10/26/2013   PLT 340  10/26/2013   Lab Results  Component Value Date   NA 139 02/12/2007   K 4.3 02/12/2007   CL 102 02/12/2007   CO2 27 02/12/2007    RADIOGRAPHIC STUDIES: I have personally reviewed the radiological reports and agreed with the findings in the report.  ASSESSMENT AND PLAN:  Left leg DVT ([place]) 02/17/2019: Ultrasound left leg: Acute DVT involving single left posterior tibial vein Previous hypercoagulability work-up done in 2008 when she was pregnant and was treated with blood thinners during the pregnancy.  Current treatment: Eliquis Risk factors:  1. Mild Protein S deficiency (detected in 2008),  2. sedentary nests related to work: Encouraged her to use a standing desk,  3. recent long car ride  4. high BMI/obesity  Hypercoagulability work-up done in 2008: Negative for lupus anticoagulant, protein S activity: 58%, protein S antigen 129%, protein C activity 85%, protein C antigen 85%  I discussed with the patient that it is unlikely that the protein S deficiency is the cause of her blood clot. We will recheck for antiphospholipid antibodies as well as  factor V Leiden and prothrombin gene mutation because of her extensive family history of her father sisters and brother all having had blood clots.   Unless the lupus anticoagulant is positive, she should remain on blood thinners for 6 months. Hormone therapy recommendation: Based upon recurrent blood clots, I do not recommend hormone therapy.  Recommended duration of treatment 1 year   All questions were answered. The patient knows to call the clinic with any problems, questions or concerns.   Rulon Eisenmenger, MD, MPH 03/31/2019    I, Molly Dorshimer, am acting as scribe for Nicholas Lose, MD.  I have reviewed the above documentation for accuracy and completeness, and I agree with the above.

## 2019-03-31 ENCOUNTER — Other Ambulatory Visit: Payer: Self-pay

## 2019-03-31 ENCOUNTER — Inpatient Hospital Stay: Payer: Managed Care, Other (non HMO) | Attending: Hematology and Oncology | Admitting: Hematology and Oncology

## 2019-03-31 ENCOUNTER — Inpatient Hospital Stay: Payer: Managed Care, Other (non HMO)

## 2019-03-31 DIAGNOSIS — Z8249 Family history of ischemic heart disease and other diseases of the circulatory system: Secondary | ICD-10-CM | POA: Diagnosis not present

## 2019-03-31 DIAGNOSIS — I82402 Acute embolism and thrombosis of unspecified deep veins of left lower extremity: Secondary | ICD-10-CM | POA: Diagnosis not present

## 2019-03-31 DIAGNOSIS — Z79899 Other long term (current) drug therapy: Secondary | ICD-10-CM | POA: Diagnosis not present

## 2019-03-31 DIAGNOSIS — E669 Obesity, unspecified: Secondary | ICD-10-CM | POA: Insufficient documentation

## 2019-03-31 DIAGNOSIS — Z833 Family history of diabetes mellitus: Secondary | ICD-10-CM | POA: Insufficient documentation

## 2019-03-31 DIAGNOSIS — Z791 Long term (current) use of non-steroidal anti-inflammatories (NSAID): Secondary | ICD-10-CM | POA: Insufficient documentation

## 2019-03-31 DIAGNOSIS — D6859 Other primary thrombophilia: Secondary | ICD-10-CM | POA: Insufficient documentation

## 2019-03-31 DIAGNOSIS — I82442 Acute embolism and thrombosis of left tibial vein: Secondary | ICD-10-CM

## 2019-03-31 NOTE — Assessment & Plan Note (Signed)
02/17/2019: Ultrasound left leg: Acute DVT involving single left posterior tibial vein Current treatment: Eliquis Risk factors: Protein S deficiency Hypercoagulability work-up done in 2008: Negative for lupus anticoagulant, protein S activity: 58%, protein S antigen 129%, protein C activity 85%, protein C antigen 85%  I discussed with the patient that it is unlikely that the protein S deficiency is the cause of her blood clot. Recommended duration of treatment 1 year

## 2019-04-01 ENCOUNTER — Telehealth: Payer: Self-pay | Admitting: Hematology and Oncology

## 2019-04-01 LAB — LUPUS ANTICOAGULANT PANEL
DRVVT: 44.2 s (ref 0.0–47.0)
PTT Lupus Anticoagulant: 30.4 s (ref 0.0–51.9)

## 2019-04-01 LAB — PROTEIN S, TOTAL: Protein S Ag, Total: 77 % (ref 60–150)

## 2019-04-01 NOTE — Telephone Encounter (Signed)
I talk with patient regarding schedule  

## 2019-04-03 ENCOUNTER — Telehealth: Payer: Self-pay | Admitting: Hematology and Oncology

## 2019-04-03 LAB — FACTOR 5 LEIDEN

## 2019-04-03 NOTE — Telephone Encounter (Signed)
I left a voicemail for the patient that the blood work for hypercoagulability was negative. Recommendation for anticoagulation: 6 months. Protein S was also normal.

## 2019-04-06 LAB — PROTHROMBIN GENE MUTATION

## 2019-05-29 ENCOUNTER — Ambulatory Visit: Payer: Managed Care, Other (non HMO) | Attending: Internal Medicine

## 2019-05-29 DIAGNOSIS — Z23 Encounter for immunization: Secondary | ICD-10-CM

## 2019-05-29 NOTE — Progress Notes (Signed)
   Covid-19 Vaccination Clinic  Name:  Brenda Rivers    MRN: [id number] DOB: Jan 10, 1981  05/29/2019  [name] was observed post Covid-19 immunization for 15 minutes without incident. She was provided with Vaccine Information Sheet and instruction to access the V-Safe system.   [name] was instructed to call 911 with any severe reactions post vaccine: Marland Kitchen Difficulty breathing  . Swelling of face and throat  . A fast heartbeat  . A bad rash all over body  . Dizziness and weakness   Immunizations Administered    Name Date Dose VIS Date Route   Pfizer COVID-19 Vaccine 05/29/2019  9:08 AM 0.3 mL 02/13/2019 Intramuscular   Manufacturer: [place]   Lot: [id number]   [place]: [id number]

## 2019-06-23 ENCOUNTER — Ambulatory Visit: Payer: Managed Care, Other (non HMO) | Attending: Internal Medicine

## 2019-06-23 DIAGNOSIS — Z23 Encounter for immunization: Secondary | ICD-10-CM

## 2019-06-23 NOTE — Progress Notes (Signed)
   Covid-19 Vaccination Clinic  Name:  Brenda Rivers    MRN: [id number] DOB: [birth year]  06/23/2019  Brenda Rivers was observed post Covid-19 immunization for 15 minutes without incident. She was provided with Vaccine Information Sheet and instruction to access the V-Safe system.   Brenda Rivers was instructed to call 911 with any severe reactions post vaccine: Marland Kitchen Difficulty breathing  . Swelling of face and throat  . A fast heartbeat  . A bad rash all over body  . Dizziness and weakness   Immunizations Administered    Name Date Dose VIS Date Route   Pfizer COVID-19 Vaccine 06/23/2019  8:37 AM 0.3 mL 04/29/2018 Intramuscular   Manufacturer: [place]   Lot: [id number]   [place]: [id number]

## 2019-08-28 ENCOUNTER — Other Ambulatory Visit: Payer: Self-pay | Admitting: Family Medicine

## 2019-08-28 DIAGNOSIS — I82402 Acute embolism and thrombosis of unspecified deep veins of left lower extremity: Secondary | ICD-10-CM

## 2019-09-08 ENCOUNTER — Telehealth: Payer: Self-pay | Admitting: Hematology and Oncology

## 2019-09-08 NOTE — Telephone Encounter (Signed)
Called pt per 7/6 sch message to reschedule apt . Unable to reach pt. Left message for patient to call back to reschedule.

## 2019-09-14 ENCOUNTER — Ambulatory Visit
Admission: RE | Admit: 2019-09-14 | Discharge: 2019-09-14 | Disposition: A | Discharge status: Home / Self Care | Payer: Managed Care, Other (non HMO) | Source: Ambulatory Visit | Attending: Family Medicine | Admitting: Family Medicine

## 2019-09-14 DIAGNOSIS — I82402 Acute embolism and thrombosis of unspecified deep veins of left lower extremity: Secondary | ICD-10-CM

## 2019-09-29 ENCOUNTER — Ambulatory Visit: Payer: Managed Care, Other (non HMO) | Admitting: Hematology and Oncology

## 2019-09-29 NOTE — Progress Notes (Signed)
Patient Care Team: Patient, No Pcp Per as PCP - General (General Practice)  DIAGNOSIS:    ICD-10-CM   1. Acute deep vein thrombosis (DVT) of tibial vein of left lower extremity (HCC)  I82.442     CHIEF COMPLIANT: History of DVT  INTERVAL HISTORY: Brenda Rivers is a 39 y.o. with above-mentioned history of left lower extremity DVT currently on anticoagulation with Eliquis. US of the left lower leg on 09/14/19 showed no evidence of new or recurrent DVT. She presents to the clinic today for follow-up.   ALLERGIES:  has No Known Allergies.  MEDICATIONS:  Current Outpatient Medications  Medication Sig Dispense Refill  . apixaban (ELIQUIS) 5 MG TABS tablet Take 2 tablets (10mg ) twice daily for 7 days, then 1 tablet (5mg ) twice daily 60 tablet 0  . ferrous sulfate 325 (65 FE) MG tablet Take 325 mg by mouth daily with breakfast.    . ibuprofen (ADVIL,MOTRIN) 800 MG tablet Take 1 tablet (800 mg total) by mouth every 8 (eight) hours as needed. 30 tablet 0   No current facility-administered medications for this visit.    PHYSICAL EXAMINATION: ECOG PERFORMANCE STATUS: 1 - Symptomatic but completely ambulatory  There were no vitals filed for this visit. There were no vitals filed for this visit.   LABORATORY DATA:  I have reviewed the data as listed CMP Latest Ref Rng & Units 02/12/2007  Glucose 70 - 99 mg/dL 77  BUN 6 - 23 mg/dL 11  Creatinine 0.40 - 1.20 mg/dL 0.82  Sodium 135 - 145 mEq/L 139  Potassium 3.5 - 5.3 mEq/L 4.3  Chloride 96 - 112 mEq/L 102  CO2 19 - 32 mEq/L 27  Calcium 8.4 - 10.5 mg/dL 8.9  Total Protein 6.0 - 8.3 g/dL 7.8  Total Bilirubin 0.3 - 1.2 mg/dL 0.3  Alkaline Phos 39 - 117 U/L 95  AST 0 - 37 U/L 15  ALT 0 - 35 U/L 15    Lab Results  Component Value Date   WBC 8.4 10/26/2013   HGB 11.5 (L) 10/26/2013   HCT 35.6 (L) 10/26/2013   MCV 82.0 10/26/2013   PLT 340 10/26/2013   NEUTROABS 4.5 02/12/2007    ASSESSMENT & PLAN:  Left leg DVT  (HCC) History of protein S deficiency diagnosed during pregnancy and was previously on Lovenox. History of prothrombin gene mutation and a recent DVT, diagnosed after she has swelling in her left lower extremity. [place] on 02/17/19 showed an acute DVT, and is currently on anticoagulation with Eliquis.   Other Risk factors: 1. sedentariness related to work: Encouraged her to use a standing desk,  2. long car ride  3. high BMI/obesity  Hypercoagulability work-up done in 2008: Negative for lupus anticoagulant, protein S activity: 58%, protein S antigen 129%, protein C activity 85%, protein C antigen 85%  Hyper coag W/U 03/31/19: Neg for lupus anticoagulant, Neg for PT gene mutation or Factor 5 leiden, Prot S 77%  She completed 6 months of anticoaulation. July 2021: Ultrasound lower extremity negative for DVT Discussed long term anticoagulation vs discontinuing it.  We decided to discontinue anticoagulation at this time. If she has another DVT, she will need life long anticoagulation  I discussed with her to use compression stockings to prevent leg swelling. If she has any concerns in the future regarding leg swelling or pain or discomfort we will have to obtain ultrasound of the leg for evaluation for DVT.  Return to clinic on an as-needed basis.   No  orders of the defined types were placed in this encounter.  The patient has a good understanding of the overall plan. she agrees with it. she will call with any problems that may develop before the next visit here.  Total time spent: 20 mins including face to face time and time spent for planning, charting and coordination of care  Nicholas Lose, MD 09/30/2019  I, Cloyde Reams Dorshimer, am acting as scribe for Dr. Nicholas Lose.  I have reviewed the above documentation for accuracy and completeness, and I agree with the above.

## 2019-09-30 ENCOUNTER — Other Ambulatory Visit: Payer: Self-pay

## 2019-09-30 ENCOUNTER — Inpatient Hospital Stay: Payer: Managed Care, Other (non HMO) | Attending: Hematology and Oncology | Admitting: Hematology and Oncology

## 2019-09-30 DIAGNOSIS — Z7901 Long term (current) use of anticoagulants: Secondary | ICD-10-CM | POA: Diagnosis not present

## 2019-09-30 DIAGNOSIS — D6852 Prothrombin gene mutation: Secondary | ICD-10-CM | POA: Insufficient documentation

## 2019-09-30 DIAGNOSIS — I82442 Acute embolism and thrombosis of left tibial vein: Secondary | ICD-10-CM | POA: Diagnosis not present

## 2019-09-30 DIAGNOSIS — Z79899 Other long term (current) drug therapy: Secondary | ICD-10-CM | POA: Insufficient documentation

## 2019-09-30 DIAGNOSIS — Z86718 Personal history of other venous thrombosis and embolism: Secondary | ICD-10-CM | POA: Insufficient documentation

## 2019-09-30 NOTE — Assessment & Plan Note (Signed)
History of protein S deficiency diagnosed during pregnancy and was previously on Lovenox. History of prothrombin gene mutation and a recent DVT, diagnosed after she has swelling in her left lower extremity. [place] on 02/17/19 showed an acute DVT, and is currently on anticoagulation with Eliquis.   Other Risk factors: 1. sedentariness related to work: Encouraged her to use a standing desk,  2. long car ride  3. high BMI/obesity  Hypercoagulability work-up done in 2008: Negative for lupus anticoagulant, protein S activity: 58%, protein S antigen 129%, protein C activity 85%, protein C antigen 85%  Hyper coag W/U 03/31/19: Neg for lupus anticoagulant, Neg for PT gene mutation or Factor 5 leiden, Prot S 77%  She completed 6 months of anticoaulation. Discussed long term anticoagulation vs discontinuing it. If she has another DVT, she will need life long anticoagulation

## 2019-10-01 ENCOUNTER — Telehealth: Payer: Self-pay | Admitting: Hematology and Oncology

## 2019-10-01 NOTE — Telephone Encounter (Signed)
No 7/28 los, no changes made to pt schedule

## 2019-10-27 ENCOUNTER — Encounter (HOSPITAL_COMMUNITY): Payer: Self-pay | Admitting: Emergency Medicine

## 2019-10-27 ENCOUNTER — Emergency Department (HOSPITAL_COMMUNITY)
Admission: EM | Admit: 2019-10-27 | Discharge: 2019-10-27 | Disposition: A | Discharge status: Home / Self Care | Payer: Managed Care, Other (non HMO) | Attending: Emergency Medicine | Admitting: Emergency Medicine

## 2019-10-27 ENCOUNTER — Other Ambulatory Visit: Payer: Self-pay

## 2019-10-27 ENCOUNTER — Ambulatory Visit
Admission: EM | Admit: 2019-10-27 | Discharge: 2019-10-27 | Disposition: A | Discharge status: Home / Self Care | Payer: Managed Care, Other (non HMO)

## 2019-10-27 DIAGNOSIS — M79602 Pain in left arm: Secondary | ICD-10-CM

## 2019-10-27 DIAGNOSIS — R2232 Localized swelling, mass and lump, left upper limb: Secondary | ICD-10-CM | POA: Diagnosis not present

## 2019-10-27 DIAGNOSIS — D6859 Other primary thrombophilia: Secondary | ICD-10-CM | POA: Diagnosis not present

## 2019-10-27 DIAGNOSIS — M7989 Other specified soft tissue disorders: Secondary | ICD-10-CM | POA: Diagnosis not present

## 2019-10-27 DIAGNOSIS — M79622 Pain in left upper arm: Secondary | ICD-10-CM | POA: Diagnosis present

## 2019-10-27 DIAGNOSIS — Z86718 Personal history of other venous thrombosis and embolism: Secondary | ICD-10-CM | POA: Diagnosis not present

## 2019-10-27 DIAGNOSIS — Z7901 Long term (current) use of anticoagulants: Secondary | ICD-10-CM | POA: Diagnosis not present

## 2019-10-27 MED ORDER — APIXABAN 5 MG PO TABS: ORAL_TABLET | ORAL | 0 refills | Status: DC

## 2019-10-27 NOTE — ED Triage Notes (Signed)
Sent by [facility] for further eval of L upper arm pain/swelling X2 days. History of DVT and been off Eliquis X1 mo. Pulses present, cap refill <3 sec.

## 2019-10-27 NOTE — ED Provider Notes (Signed)
[facility]    CSN: [id number] Arrival date & time: 10/27/19  1814      History   Chief Complaint Chief Complaint  Patient presents with  . Arm Pain    HPI Brenda Rivers is a 39 y.o. female with history of protein S deficiency, DVT presenting for left arm pain and swelling since yesterday.  States it became worse this morning.  Denies inciting event, increased use, injury.  Patient does state this feels similar to when she had lower leg DVT.  Denies chest pain, palpitations, diaphoresis, shortness of breath.  Has not tried thing for this.  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  Is not currently on anticoagulant or blood thinner use.    Past Medical History:  Diagnosis Date  . Complication of anesthesia    epidural x2 and "still felt pain"  . Protein S deficiency [facility])     Patient Active Problem List   Diagnosis Date Noted  . Left leg DVT ([place]) 03/31/2019  . Status post repeat low transverse cesarean section 04/17/2013  . Post-dates pregnancy 04/15/2013    Past Surgical History:  Procedure Laterality Date  . CESAREAN SECTION    . CESAREAN SECTION N/A 04/16/2013   Procedure: CESAREAN SECTION Baby Girl @0853  Apgar 8/9;  Surgeon: Annalee Genta, DO;  Location: [facility];  Service: Obstetrics;  Laterality: N/A;  Arrest to descend 41Weeks  . LAPAROSCOPIC TUBAL LIGATION Bilateral 10/28/2013   Procedure: LAPAROSCOPIC TUBAL LIGATION;  Surgeon: Maeola Sarah. Landry Mellow, MD;  Location: [facility];  Service: Gynecology;  Laterality: Bilateral;    OB History    Gravida  2   Para  2   Term  2   Preterm  0   AB  0   Living  2     SAB  0   TAB  0   Ectopic  0   Multiple  0   Live Births  2            Home Medications    Prior to Admission medications   Not on File    Family History Family History  Problem Relation Age of Onset  . Diabetes Mother   . Protein C deficiency Father   . Hypertension Father     Social History Social History    Tobacco Use  . Smoking status: Never Smoker  . Smokeless tobacco: Never Used  Substance Use Topics  . Alcohol use: No  . Drug use: No     Allergies   Patient has no known allergies.   Review of Systems As per HPI   Physical Exam Triage Vital Signs ED Triage Vitals  Enc Vitals Group     BP      Pulse      Resp      Temp      Temp src      SpO2      Weight      Height      Head Circumference      Peak Flow      Pain Score      Pain Loc      Pain Edu?      Excl. in [place]?    No data found.  Updated Vital Signs BP (!) 139/94 (BP Location: Right Arm)   Pulse 100   Temp 98.7 F (37.1 C) (Oral)   Resp 18   LMP 10/27/2019   SpO2 98%   Visual Acuity Right Eye Distance:  Left Eye Distance:   Bilateral Distance:    Right Eye Near:   Left Eye Near:     Bilateral Near:     Physical Exam Constitutional:      General: She is not in acute distress. HENT:     Head: Normocephalic and atraumatic.  Eyes:     General: No scleral icterus.    Pupils: Pupils are equal, round, and reactive to light.  Cardiovascular:     Rate and Rhythm: Normal rate.  Pulmonary:     Effort: Pulmonary effort is normal.  Musculoskeletal:        General: Swelling and tenderness present. Normal range of motion.     Comments: Left distal upper arm swollen as compared to right.  Tender over [facility] without palpable cord.  Neurovascular intact.  Skin:    Coloration: Skin is not jaundiced or pale.  Neurological:     Mental Status: She is alert and oriented to person, place, and time.      UC Treatments / Results  Labs (all labs ordered are listed, but only abnormal results are displayed) Labs Reviewed - No data to display  EKG   Radiology No results found.  Procedures Procedures (including critical care time)  Medications Ordered in UC Medications - No data to display  Initial Impression / Assessment and Plan / UC Course  I have reviewed the triage vital signs and the  nursing notes.  Pertinent labs & imaging results that were available during my care of the patient were reviewed by me and considered in my medical decision making (see chart for details).     Given patient's history of unprovoked DVT in setting of coagulopathy and lack of anticoagulant/blood thinner.  Recommended patient go to ER for further evaluation.  Patient electing to self transport in stable condition. Final Clinical Impressions(s) / UC Diagnoses   Final diagnoses:  Protein S deficiency ([place])  Left arm swelling  Left arm pain  History of DVT in adulthood   Discharge Instructions   None    ED Prescriptions    None     PDMP not reviewed this encounter.   Neldon Mc [place], [place] 10/27/19 1849

## 2019-10-27 NOTE — ED Provider Notes (Signed)
[facility] EMERGENCY DEPARTMENT Provider Note   CSN: [id number] Arrival date & time: 10/27/19  1927     History Chief Complaint  Patient presents with  . Arm Pain    Brenda Rivers is a 39 y.o. female.  Presents to the emergency room with concern for arm pain.  Patient reports that she has had some mild intermittent dull achy arm pain on her left upper arm for the past 2 or 3 days.  Today she noted a mild amount of swelling as well.  No skin discoloration, no associated numbness or weakness.  She reports history of DVT, protein S deficiency.  Stopped Eliquis around a month ago.  HPI     Past Medical History:  Diagnosis Date  . Complication of anesthesia    epidural x2 and "still felt pain"  . DVT (deep venous thrombosis) ([place])   . Protein S deficiency [facility])     Patient Active Problem List   Diagnosis Date Noted  . Left leg DVT ([place]) 03/31/2019  . Status post repeat low transverse cesarean section 04/17/2013  . Post-dates pregnancy 04/15/2013    Past Surgical History:  Procedure Laterality Date  . CESAREAN SECTION    . CESAREAN SECTION N/A 04/16/2013   Procedure: CESAREAN SECTION Baby Girl @0853  Apgar 8/9;  Surgeon: Annalee Genta, DO;  Location: [facility];  Service: Obstetrics;  Laterality: N/A;  Arrest to descend 41Weeks  . LAPAROSCOPIC TUBAL LIGATION Bilateral 10/28/2013   Procedure: LAPAROSCOPIC TUBAL LIGATION;  Surgeon: Maeola Sarah. Landry Mellow, MD;  Location: [facility];  Service: Gynecology;  Laterality: Bilateral;     OB History    Gravida  2   Para  2   Term  2   Preterm  0   AB  0   Living  2     SAB  0   TAB  0   Ectopic  0   Multiple  0   Live Births  2           Family History  Problem Relation Age of Onset  . Diabetes Mother   . Protein C deficiency Father   . Hypertension Father     Social History   Tobacco Use  . Smoking status: Never Smoker  . Smokeless tobacco: Never Used  Substance Use Topics  . Alcohol use: No    . Drug use: No    Home Medications Prior to Admission medications   Medication Sig Start Date End Date Taking? Authorizing Provider  apixaban (ELIQUIS) 5 MG TABS tablet Take 2 tablets (10mg ) twice daily for 7 days, then 1 tablet (5mg ) twice daily 10/27/19   Lucrezia Starch, MD    Allergies    Patient has no known allergies.  Review of Systems   Review of Systems  Constitutional: Negative for chills and fever.  HENT: Negative for ear pain and sore throat.   Eyes: Negative for pain and visual disturbance.  Respiratory: Negative for cough and shortness of breath.   Cardiovascular: Negative for chest pain and palpitations.  Gastrointestinal: Negative for abdominal pain and vomiting.  Genitourinary: Negative for dysuria and hematuria.  Musculoskeletal: Positive for arthralgias. Negative for back pain.  Skin: Negative for color change and rash.  Neurological: Negative for seizures and syncope.  All other systems reviewed and are negative.   Physical Exam Updated Vital Signs BP (!) 140/100 (BP Location: Right Arm)   Pulse 89   Temp 99 F (37.2 C)   Resp 18  Ht 5' (1.524 m)   Wt 86.2 kg   LMP 10/27/2019   SpO2 100%   BMI 37.11 kg/m   Physical Exam Vitals and nursing note reviewed.  Constitutional:      General: She is not in acute distress.    Appearance: She is well-developed.  HENT:     Head: Normocephalic and atraumatic.  Eyes:     Conjunctiva/sclera: Conjunctivae normal.  Cardiovascular:     Rate and Rhythm: Normal rate and regular rhythm.     Heart sounds: No murmur heard.   Pulmonary:     Effort: Pulmonary effort is normal. No respiratory distress.     Breath sounds: Normal breath sounds.  Abdominal:     Palpations: Abdomen is soft.     Tenderness: There is no abdominal tenderness.  Musculoskeletal:     Cervical back: Neck supple.     Comments: LUE: no swelling, edema, normal sensation, cap refill, normal radial pulse  Skin:    General: Skin is warm  and dry.  Neurological:     General: No focal deficit present.     Mental Status: She is alert.     ED Results / Procedures / Treatments   Labs (all labs ordered are listed, but only abnormal results are displayed) Labs Reviewed - No data to display  EKG None  Radiology No results found.  Procedures Procedures (including critical care time)  Medications Ordered in ED Medications - No data to display  ED Course  I have reviewed the triage vital signs and the nursing notes.  Pertinent labs & imaging results that were available during my care of the patient were reviewed by me and considered in my medical decision making (see chart for details).    MDM Rules/Calculators/A&P                         38 year old lady presents to ER with history of DVT, protein S deficiency, now with left upper arm swelling.  Not currently on anticoagulation.  On physical exam her arm appears normal, she has good pulses, there is no obvious deformity.  Given her symptoms, believe patient would benefit from ultrasound to rule out DVT.  Directed patient to return tomorrow morning for ultrasound.  In the meantime, patient already has Eliquis at home.  Recommend restarting Eliquis for tonight, continuing if she does not fact have a DVT.  If she does not have a DVT, recommend follow-up with primary doctor and her hematologist.    After the discussed management above, the patient was determined to be safe for discharge.  The patient was in agreement with this plan and all questions regarding their care were answered.  ED return precautions were discussed and the patient will return to the ED with any significant worsening of condition.   Final Clinical Impression(s) / ED Diagnoses Final diagnoses:  Left arm swelling  Protein S deficiency ([place])    Rx / DC Orders ED Discharge Orders         Ordered    apixaban (ELIQUIS) 5 MG TABS tablet        10/27/19 2037    UE VENOUS DUPLEX        10/27/19 2039            Lucrezia Starch, MD 10/27/19 2135

## 2019-10-27 NOTE — ED Notes (Signed)
Patient verbalizes understanding of discharge instructions. Opportunity for questioning and answers were provided. Armband removed by staff, pt discharged from ED ambulatory by self\  

## 2019-10-27 NOTE — ED Notes (Signed)
Patient is being discharged from the Urgent Care and sent to the Emergency Department via POV . Per [place], [place], patient is in need of higher level of care due to unilateral extremity swelling. Patient is aware and verbalizes understanding of plan of care.  Vitals:   10/27/19 1822  BP: (!) 139/94  Pulse: 100  Resp: 18  Temp: 98.7 F (37.1 C)  SpO2: 98%

## 2019-10-27 NOTE — ED Triage Notes (Signed)
Pt c/o L arm pain and swelling x 2 days, denies injury. Hx of DVT

## 2019-10-27 NOTE — Discharge Instructions (Signed)
For tonight, I recommend taking a 10 mg dose of your Eliquis, this is equivalent to 2 tablets of your 5 mg tablets. Please come back tomorrow morning for your ultrasound of your arm. If we find you have a blood clot in your arm, I would recommend resuming your Eliquis. When restarting this medication, you will need to take 10 mg (2 tablets) twice daily for 1 week followed by 5 mg (1 tablet) twice daily thereafter. Additionally I would recommend follow-up with your primary doctor and your hematologist.

## 2019-10-28 ENCOUNTER — Other Ambulatory Visit: Payer: Self-pay

## 2019-10-28 ENCOUNTER — Ambulatory Visit (HOSPITAL_COMMUNITY)
Admission: RE | Admit: 2019-10-28 | Discharge: 2019-10-28 | Disposition: A | Discharge status: Home / Self Care | Payer: Managed Care, Other (non HMO) | Source: Ambulatory Visit | Attending: Emergency Medicine | Admitting: Emergency Medicine

## 2019-10-28 DIAGNOSIS — M79602 Pain in left arm: Secondary | ICD-10-CM | POA: Insufficient documentation

## 2019-10-28 DIAGNOSIS — M79609 Pain in unspecified limb: Secondary | ICD-10-CM | POA: Diagnosis not present

## 2019-10-28 DIAGNOSIS — Z86718 Personal history of other venous thrombosis and embolism: Secondary | ICD-10-CM | POA: Diagnosis not present

## 2019-10-28 DIAGNOSIS — M7989 Other specified soft tissue disorders: Secondary | ICD-10-CM | POA: Diagnosis not present

## 2019-10-28 DIAGNOSIS — D6859 Other primary thrombophilia: Secondary | ICD-10-CM | POA: Diagnosis not present

## 2019-10-28 NOTE — Progress Notes (Signed)
Upper extremity venous LT study completed.   Patient escorted back to ED registration.   Please see CV Proc for preliminary results.   Brenda Rivers

## 2019-11-19 ENCOUNTER — Telehealth: Payer: Self-pay | Admitting: Hematology and Oncology

## 2019-11-19 NOTE — Telephone Encounter (Signed)
Scheduled per 9/15 staff msg. Called and left a msg.

## 2019-11-24 ENCOUNTER — Ambulatory Visit: Payer: Managed Care, Other (non HMO) | Admitting: Hematology and Oncology

## 2019-11-29 NOTE — Progress Notes (Signed)
Patient Care Team: Patient, No Pcp Per as PCP - General (General Practice)  DIAGNOSIS:    ICD-10-CM   1. Acute deep vein thrombosis (DVT) of tibial vein of left lower extremity (HCC)  I82.442     CHIEF COMPLIANT: History ofDVT, recent SVT of left arm  INTERVAL HISTORY: Brenda Rivers is a 39 y.o. with above-mentioned history of left lower extremity DVT previously on anticoagulation with Eliquis. She presented to the ED on 10/27/19 for arm pain. US of the left upper extremity showed acute superficial vein thrombosis involving the left basilic vein and an age indeterminate deep vein thrombosis involving a singular ulnar vein. She presents to the clinic today for follow-up.   ALLERGIES:  has No Known Allergies.  MEDICATIONS:  Current Outpatient Medications  Medication Sig Dispense Refill  . apixaban (ELIQUIS) 5 MG TABS tablet Take 2 tablets (10mg ) twice daily for 7 days, then 1 tablet (5mg ) twice daily 60 tablet 0   No current facility-administered medications for this visit.    PHYSICAL EXAMINATION: ECOG PERFORMANCE STATUS: 1 - Symptomatic but completely ambulatory  Vitals:   11/30/19 1425  BP: 98/77  Pulse: 94  Resp: 18  Temp: (!) 97.4 F (36.3 C)  SpO2: 99%   Filed Weights   11/30/19 1425  Weight: 193 lb 9.6 oz (87.8 kg)    LABORATORY DATA:  I have reviewed the data as listed CMP Latest Ref Rng & Units 02/12/2007  Glucose 70 - 99 mg/dL 77  BUN 6 - 23 mg/dL 11  Creatinine 0.40 - 1.20 mg/dL 0.82  Sodium 135 - 145 mEq/L 139  Potassium 3.5 - 5.3 mEq/L 4.3  Chloride 96 - 112 mEq/L 102  CO2 19 - 32 mEq/L 27  Calcium 8.4 - 10.5 mg/dL 8.9  Total Protein 6.0 - 8.3 g/dL 7.8  Total Bilirubin 0.3 - 1.2 mg/dL 0.3  Alkaline Phos 39 - 117 U/L 95  AST 0 - 37 U/L 15  ALT 0 - 35 U/L 15    Lab Results  Component Value Date   WBC 8.4 10/26/2013   HGB 11.5 (L) 10/26/2013   HCT 35.6 (L) 10/26/2013   MCV 82.0 10/26/2013   PLT 340 10/26/2013   NEUTROABS 4.5 02/12/2007     ASSESSMENT & PLAN:  Left leg DVT ([place]) History of protein S deficiency diagnosed during pregnancy and was previously on Lovenox. History of prothrombin gene mutation and a recent DVT, diagnosed after she has swelling in her left lower extremity. [place] on 02/17/19 showed an acute DVT, and is currently on anticoagulation with Eliquis.   Other Risk factors: 1. sedentariness related to work: Encouraged her to use a standing desk,  2.long car ride 3.high BMI/obesity  Hypercoagulability work-up done in 2008: Negative for lupus anticoagulant, protein S activity: 58%, protein S antigen 129%, protein C activity 85%, protein C antigen 85%  Hyper coag W/U 03/31/19: Neg for lupus anticoagulant, Neg for PT gene mutation or Factor 5 leiden, Prot S 77%  She completed 6 months of anticoaulation and discontinued 09/30/2019. ---------------------------------------------------------------------------------------------------------------------------------------------------------- July 2021: Ultrasound lower extremity negative for DVT 3/71/6967: SVT left basilic vein: Plan is 3 months of anticoagulation which will be completed end of #2021. Heavy uterine bleeding: Patient attributes this to being on blood thinners.  Her gynecologist suggested microablation versus hysterectomy.  I think micro ablation sounds reasonable to try. Since she will stop her anticoagulation November, we can wait and see if her uterine bleeding gets better after that.  If she gets any  other future superficial or deep vein thrombosis then we will need to keep her on blood thinners for life. I instructed her next time to call us directly so that we can arrange for the ultrasound and follow-up on that. Return to clinic on an as-needed basis.   No orders of the defined types were placed in this encounter.  The patient has a good understanding of the overall plan. she agrees with it. she will call with any problems that may develop  before the next visit here.  Total time spent: 30 mins including face to face time and time spent for planning, charting and coordination of care  Nicholas Lose, MD 11/30/2019  I, Cloyde Reams Dorshimer, am acting as scribe for Dr. Nicholas Lose.  I have reviewed the above documentation for accuracy and completeness, and I agree with the above.

## 2019-11-30 ENCOUNTER — Other Ambulatory Visit: Payer: Self-pay | Admitting: Obstetrics and Gynecology

## 2019-11-30 ENCOUNTER — Other Ambulatory Visit: Payer: Self-pay

## 2019-11-30 ENCOUNTER — Inpatient Hospital Stay: Payer: Managed Care, Other (non HMO) | Attending: Hematology and Oncology | Admitting: Hematology and Oncology

## 2019-11-30 DIAGNOSIS — D6859 Other primary thrombophilia: Secondary | ICD-10-CM | POA: Insufficient documentation

## 2019-11-30 DIAGNOSIS — Z86718 Personal history of other venous thrombosis and embolism: Secondary | ICD-10-CM | POA: Diagnosis present

## 2019-11-30 DIAGNOSIS — Z7901 Long term (current) use of anticoagulants: Secondary | ICD-10-CM | POA: Insufficient documentation

## 2019-11-30 DIAGNOSIS — Z1231 Encounter for screening mammogram for malignant neoplasm of breast: Secondary | ICD-10-CM

## 2019-11-30 DIAGNOSIS — E669 Obesity, unspecified: Secondary | ICD-10-CM | POA: Diagnosis not present

## 2019-11-30 DIAGNOSIS — I82442 Acute embolism and thrombosis of left tibial vein: Secondary | ICD-10-CM | POA: Diagnosis not present

## 2019-11-30 DIAGNOSIS — N939 Abnormal uterine and vaginal bleeding, unspecified: Secondary | ICD-10-CM | POA: Insufficient documentation

## 2019-11-30 MED ORDER — APIXABAN 5 MG PO TABS: ORAL_TABLET | ORAL | 1 refills | Status: DC

## 2019-11-30 NOTE — Assessment & Plan Note (Signed)
History of protein S deficiency diagnosed during pregnancy and was previously on Lovenox. History of prothrombin gene mutation and a recent DVT, diagnosed after she has swelling in her left lower extremity. [place] on 02/17/19 showed an acute DVT, and is currently on anticoagulation with Eliquis.   Other Risk factors: 1. sedentariness related to work: Encouraged her to use a standing desk,  2.long car ride 3.high BMI/obesity  Hypercoagulability work-up done in 2008: Negative for lupus anticoagulant, protein S activity: 58%, protein S antigen 129%, protein C activity 85%, protein C antigen 85%  Hyper coag W/U 03/31/19: Neg for lupus anticoagulant, Neg for PT gene mutation or Factor 5 leiden, Prot S 77%  She completed 6 months of anticoaulation and discontinued 09/30/2019. July 2021: Ultrasound lower extremity negative for DVT  Return to clinic on an as-needed basis

## 2019-12-01 ENCOUNTER — Telehealth: Payer: Self-pay | Admitting: Hematology and Oncology

## 2019-12-01 NOTE — Telephone Encounter (Signed)
No 9/27 los, no changes made to pt schedule

## 2019-12-02 ENCOUNTER — Ambulatory Visit: Payer: Managed Care, Other (non HMO)

## 2020-01-11 ENCOUNTER — Ambulatory Visit
Admission: RE | Admit: 2020-01-11 | Discharge: 2020-01-11 | Disposition: A | Discharge status: Home / Self Care | Payer: Managed Care, Other (non HMO) | Source: Ambulatory Visit | Attending: Obstetrics and Gynecology | Admitting: Obstetrics and Gynecology

## 2020-01-11 ENCOUNTER — Other Ambulatory Visit: Payer: Self-pay

## 2020-01-11 DIAGNOSIS — Z1231 Encounter for screening mammogram for malignant neoplasm of breast: Secondary | ICD-10-CM

## 2020-02-19 ENCOUNTER — Encounter (HOSPITAL_BASED_OUTPATIENT_CLINIC_OR_DEPARTMENT_OTHER): Payer: Self-pay | Admitting: Obstetrics and Gynecology

## 2020-02-19 ENCOUNTER — Other Ambulatory Visit: Payer: Self-pay

## 2020-02-23 NOTE — Progress Notes (Signed)
Called and spoke with patient regarding holiday plans and quarantine. Pt explained that her family is in [place] and she and her husband are going to celebrate together at home. No plans to eat out, grocery shop or attend church etc.  Verbalized understanding of quarantine.

## 2020-02-25 ENCOUNTER — Other Ambulatory Visit (HOSPITAL_COMMUNITY)
Admission: RE | Admit: 2020-02-25 | Discharge: 2020-02-25 | Disposition: A | Discharge status: Home / Self Care | Payer: Managed Care, Other (non HMO) | Source: Ambulatory Visit | Attending: Obstetrics and Gynecology | Admitting: Obstetrics and Gynecology

## 2020-02-25 DIAGNOSIS — Z20822 Contact with and (suspected) exposure to covid-19: Secondary | ICD-10-CM | POA: Insufficient documentation

## 2020-02-25 DIAGNOSIS — Z01812 Encounter for preprocedural laboratory examination: Secondary | ICD-10-CM | POA: Diagnosis not present

## 2020-02-25 LAB — SARS CORONAVIRUS 2 (TAT 6-24 HRS): SARS Coronavirus 2: NEGATIVE

## 2020-02-27 NOTE — Anesthesia Preprocedure Evaluation (Addendum)
Anesthesia Evaluation  Patient identified by MRN, date of birth, ID band Patient awake    Reviewed: Allergy & Precautions, NPO status , Patient's Chart, lab work & pertinent test results  Airway Mallampati: II  TM Distance: >3 FB Neck ROM: Full    Dental no notable dental hx. (+) Teeth Intact, Dental Advisory Given   Pulmonary neg pulmonary ROS,    Pulmonary exam normal breath sounds clear to auscultation       Cardiovascular Exercise Tolerance: Good + DVT  Normal cardiovascular exam Rhythm:Regular Rate:Normal     Neuro/Psych negative neurological ROS     GI/Hepatic negative GI ROS, Neg liver ROS,   Endo/Other  negative endocrine ROS  Renal/GU negative Renal ROS     Musculoskeletal negative musculoskeletal ROS (+)   Abdominal   Peds  Hematology negative hematology ROS (+) Protein S deficiency   Anesthesia Other Findings   Reproductive/Obstetrics negative OB ROS                            Anesthesia Physical Anesthesia Plan  ASA: II  Anesthesia Plan: General   Post-op Pain Management:    Induction: Intravenous  PONV Risk Score and Plan: 4 or greater and Treatment may vary due to age or medical condition, Ondansetron, Dexamethasone and Midazolam  Airway Management Planned: LMA  Additional Equipment:   Intra-op Plan:   Post-operative Plan:   Informed Consent: I have reviewed the patients History and Physical, chart, labs and discussed the procedure including the risks, benefits and alternatives for the proposed anesthesia with the patient or authorized representative who has indicated his/her understanding and acceptance.     Dental advisory given  Plan Discussed with: CRNA and Anesthesiologist  Anesthesia Plan Comments:        Anesthesia Quick Evaluation

## 2020-02-28 ENCOUNTER — Other Ambulatory Visit: Payer: Self-pay | Admitting: Obstetrics and Gynecology

## 2020-02-29 ENCOUNTER — Ambulatory Visit (HOSPITAL_BASED_OUTPATIENT_CLINIC_OR_DEPARTMENT_OTHER)
Admission: RE | Admit: 2020-02-29 | Discharge: 2020-02-29 | Disposition: A | Discharge status: Home / Self Care | Payer: Managed Care, Other (non HMO) | Attending: Obstetrics and Gynecology | Admitting: Obstetrics and Gynecology

## 2020-02-29 ENCOUNTER — Ambulatory Visit (HOSPITAL_BASED_OUTPATIENT_CLINIC_OR_DEPARTMENT_OTHER): Payer: Managed Care, Other (non HMO) | Admitting: Anesthesiology

## 2020-02-29 ENCOUNTER — Encounter (HOSPITAL_BASED_OUTPATIENT_CLINIC_OR_DEPARTMENT_OTHER): Payer: Self-pay | Admitting: Obstetrics and Gynecology

## 2020-02-29 ENCOUNTER — Encounter (HOSPITAL_BASED_OUTPATIENT_CLINIC_OR_DEPARTMENT_OTHER)
Admission: RE | Disposition: A | Discharge status: Home / Self Care | Payer: Self-pay | Source: Home / Self Care | Attending: Obstetrics and Gynecology

## 2020-02-29 ENCOUNTER — Other Ambulatory Visit: Payer: Self-pay

## 2020-02-29 DIAGNOSIS — Z86718 Personal history of other venous thrombosis and embolism: Secondary | ICD-10-CM | POA: Diagnosis not present

## 2020-02-29 DIAGNOSIS — N859 Noninflammatory disorder of uterus, unspecified: Secondary | ICD-10-CM | POA: Insufficient documentation

## 2020-02-29 DIAGNOSIS — N920 Excessive and frequent menstruation with regular cycle: Secondary | ICD-10-CM | POA: Insufficient documentation

## 2020-02-29 DIAGNOSIS — Z7901 Long term (current) use of anticoagulants: Secondary | ICD-10-CM | POA: Diagnosis not present

## 2020-02-29 DIAGNOSIS — D259 Leiomyoma of uterus, unspecified: Secondary | ICD-10-CM | POA: Insufficient documentation

## 2020-02-29 HISTORY — PX: DILITATION & CURRETTAGE/HYSTROSCOPY WITH HYDROTHERMAL ABLATION: SHX5570

## 2020-02-29 LAB — POCT PREGNANCY, URINE: Preg Test, Ur: NEGATIVE

## 2020-02-29 SURGERY — DILATATION & CURETTAGE/HYSTEROSCOPY WITH HYDROTHERMAL ABLATION
Anesthesia: General | Site: "Vagina "

## 2020-02-29 MED ORDER — PROPOFOL 10 MG/ML IV BOLUS
INTRAVENOUS | Status: DC | PRN
  Administered 2020-02-29: 150 mg via INTRAVENOUS

## 2020-02-29 MED ORDER — ONDANSETRON HCL 4 MG/2ML IJ SOLN: 4.0000 mg | Freq: Once | INTRAMUSCULAR | Status: DC | PRN

## 2020-02-29 MED ORDER — ACETAMINOPHEN 500 MG PO TABS
ORAL_TABLET | ORAL | Status: AC
  Filled 2020-02-29: qty 2

## 2020-02-29 MED ORDER — POVIDONE-IODINE 10 % EX SWAB: 2.0000 "application " | Freq: Once | CUTANEOUS | Status: DC

## 2020-02-29 MED ORDER — MIDAZOLAM HCL 5 MG/5ML IJ SOLN
INTRAMUSCULAR | Status: DC | PRN
  Administered 2020-02-29: 2 mg via INTRAVENOUS

## 2020-02-29 MED ORDER — FENTANYL CITRATE (PF) 100 MCG/2ML IJ SOLN
INTRAMUSCULAR | Status: DC | PRN
  Administered 2020-02-29 (×4): 25 ug via INTRAVENOUS

## 2020-02-29 MED ORDER — CELECOXIB 200 MG PO CAPS
ORAL_CAPSULE | ORAL | Status: AC
  Filled 2020-02-29: qty 2

## 2020-02-29 MED ORDER — CELECOXIB 200 MG PO CAPS
400.0000 mg | ORAL_CAPSULE | ORAL | Status: AC
  Administered 2020-02-29: 11:00:00 400 mg via ORAL

## 2020-02-29 MED ORDER — LIDOCAINE 2% (20 MG/ML) 5 ML SYRINGE
INTRAMUSCULAR | Status: AC
  Filled 2020-02-29: qty 5

## 2020-02-29 MED ORDER — MEPERIDINE HCL 25 MG/ML IJ SOLN: 6.2500 mg | INTRAMUSCULAR | Status: DC | PRN

## 2020-02-29 MED ORDER — BUPIVACAINE HCL (PF) 0.25 % IJ SOLN
INTRAMUSCULAR | Status: DC | PRN
  Administered 2020-02-29: 20 mL

## 2020-02-29 MED ORDER — SCOPOLAMINE 1 MG/3DAYS TD PT72
1.0000 | MEDICATED_PATCH | TRANSDERMAL | Status: DC
  Administered 2020-02-29: 11:00:00 1.5 mg via TRANSDERMAL

## 2020-02-29 MED ORDER — PHENYLEPHRINE HCL (PRESSORS) 10 MG/ML IV SOLN
INTRAVENOUS | Status: DC | PRN
  Administered 2020-02-29: 40 ug via INTRAVENOUS
  Administered 2020-02-29 (×3): 80 ug via INTRAVENOUS

## 2020-02-29 MED ORDER — EPHEDRINE SULFATE 50 MG/ML IJ SOLN
INTRAMUSCULAR | Status: DC | PRN
  Administered 2020-02-29: 10 mg via INTRAVENOUS

## 2020-02-29 MED ORDER — LIDOCAINE HCL (CARDIAC) PF 100 MG/5ML IV SOSY
PREFILLED_SYRINGE | INTRAVENOUS | Status: DC | PRN
  Administered 2020-02-29: 100 mg via INTRAVENOUS

## 2020-02-29 MED ORDER — SODIUM CHLORIDE 0.9 % IR SOLN
Status: DC | PRN
  Administered 2020-02-29: 1

## 2020-02-29 MED ORDER — DEXAMETHASONE SODIUM PHOSPHATE 10 MG/ML IJ SOLN
INTRAMUSCULAR | Status: DC | PRN
  Administered 2020-02-29: 10 mg via INTRAVENOUS

## 2020-02-29 MED ORDER — OXYCODONE HCL 5 MG PO TABS: 5.0000 mg | ORAL_TABLET | Freq: Once | ORAL | Status: DC | PRN

## 2020-02-29 MED ORDER — ONDANSETRON HCL 4 MG/2ML IJ SOLN
INTRAMUSCULAR | Status: DC | PRN
  Administered 2020-02-29: 4 mg via INTRAVENOUS

## 2020-02-29 MED ORDER — MIDAZOLAM HCL 2 MG/2ML IJ SOLN
INTRAMUSCULAR | Status: AC
  Filled 2020-02-29: qty 2

## 2020-02-29 MED ORDER — FENTANYL CITRATE (PF) 100 MCG/2ML IJ SOLN
INTRAMUSCULAR | Status: AC
  Filled 2020-02-29: qty 2

## 2020-02-29 MED ORDER — OXYCODONE HCL 5 MG PO TABS: 5.0000 mg | ORAL_TABLET | Freq: Four times a day (QID) | ORAL | 0 refills | Status: DC | PRN

## 2020-02-29 MED ORDER — AMISULPRIDE (ANTIEMETIC) 5 MG/2ML IV SOLN: 10.0000 mg | Freq: Once | INTRAVENOUS | Status: DC | PRN

## 2020-02-29 MED ORDER — ACETAMINOPHEN 10 MG/ML IV SOLN: 1000.0000 mg | Freq: Once | INTRAVENOUS | Status: DC | PRN

## 2020-02-29 MED ORDER — LACTATED RINGERS IV SOLN: INTRAVENOUS | Status: DC

## 2020-02-29 MED ORDER — OXYCODONE HCL 5 MG/5ML PO SOLN: 5.0000 mg | Freq: Once | ORAL | Status: DC | PRN

## 2020-02-29 MED ORDER — SILVER NITRATE-POT NITRATE 75-25 % EX MISC
CUTANEOUS | Status: DC | PRN
  Administered 2020-02-29: 2 via TOPICAL

## 2020-02-29 MED ORDER — SCOPOLAMINE 1 MG/3DAYS TD PT72
MEDICATED_PATCH | TRANSDERMAL | Status: AC
  Filled 2020-02-29: qty 1

## 2020-02-29 MED ORDER — HYDROMORPHONE HCL 1 MG/ML IJ SOLN
0.2500 mg | INTRAMUSCULAR | Status: DC | PRN
  Administered 2020-02-29: 0.5 mg via INTRAVENOUS

## 2020-02-29 MED ORDER — ACETAMINOPHEN 500 MG PO TABS
1000.0000 mg | ORAL_TABLET | ORAL | Status: AC
  Administered 2020-02-29: 11:00:00 1000 mg via ORAL

## 2020-02-29 MED ORDER — DEXAMETHASONE SODIUM PHOSPHATE 10 MG/ML IJ SOLN
INTRAMUSCULAR | Status: AC
  Filled 2020-02-29: qty 1

## 2020-02-29 MED ORDER — HYDROMORPHONE HCL 1 MG/ML IJ SOLN
INTRAMUSCULAR | Status: AC
  Filled 2020-02-29: qty 0.5

## 2020-02-29 MED ORDER — IBUPROFEN 800 MG PO TABS: 800.0000 mg | ORAL_TABLET | Freq: Three times a day (TID) | ORAL | 0 refills | Status: DC | PRN

## 2020-02-29 MED ORDER — PHENYLEPHRINE 40 MCG/ML (10ML) SYRINGE FOR IV PUSH (FOR BLOOD PRESSURE SUPPORT)
PREFILLED_SYRINGE | INTRAVENOUS | Status: AC
  Filled 2020-02-29: qty 10

## 2020-02-29 MED ORDER — SILVER NITRATE-POT NITRATE 75-25 % EX MISC
CUTANEOUS | Status: AC
  Filled 2020-02-29: qty 10

## 2020-02-29 MED ORDER — EPHEDRINE 5 MG/ML INJ
INTRAVENOUS | Status: AC
  Filled 2020-02-29: qty 10

## 2020-02-29 SURGICAL SUPPLY — 22 items
CATH ROBINSON RED A/P 16FR (CATHETERS) ×1 IMPLANT
DECANTER SPIKE VIAL GLASS SM (MISCELLANEOUS) ×4 IMPLANT
ELECT REM PT RETURN 9FT ADLT (ELECTROSURGICAL)
ELECTRODE REM PT RTRN 9FT ADLT (ELECTROSURGICAL) IMPLANT
GAUZE SPONGE 4X4 16PLY XRAY LF (GAUZE/BANDAGES/DRESSINGS) ×4 IMPLANT
GLOVE BIO SURGEON STRL SZ 6.5 (GLOVE) ×2 IMPLANT
GLOVE BIO SURGEONS STRL SZ 6.5 (GLOVE) ×1
GLOVE BIOGEL M 6.5 STRL (GLOVE) ×8 IMPLANT
GLOVE BIOGEL PI IND STRL 7.0 (GLOVE) ×2 IMPLANT
GLOVE BIOGEL PI INDICATOR 7.0 (GLOVE) ×2
GLOVE SURG SS PI 7.5 STRL IVOR (GLOVE) ×6 IMPLANT
GLOVE SURG UNDER POLY LF SZ6.5 (GLOVE) ×7 IMPLANT
GOWN STRL REUS W/ TWL LRG LVL3 (GOWN DISPOSABLE) ×3 IMPLANT
GOWN STRL REUS W/ TWL XL LVL3 (GOWN DISPOSABLE) ×2 IMPLANT
GOWN STRL REUS W/TWL LRG LVL3 (GOWN DISPOSABLE) ×2
GOWN STRL REUS W/TWL XL LVL3 (GOWN DISPOSABLE) ×4
PACK VAGINAL MINOR WOMEN LF (CUSTOM PROCEDURE TRAY) ×4 IMPLANT
PAD OB MATERNITY 4.3X12.25 (PERSONAL CARE ITEMS) ×4 IMPLANT
SET GENESYS HTA PROCERVA (MISCELLANEOUS) ×3 IMPLANT
SLEEVE SCD COMPRESS KNEE MED (MISCELLANEOUS) ×4 IMPLANT
TOWEL GREEN STERILE FF (TOWEL DISPOSABLE) ×8 IMPLANT
UNDERPAD 30X36 HEAVY ABSORB (UNDERPADS AND DIAPERS) ×4 IMPLANT

## 2020-02-29 NOTE — Anesthesia Postprocedure Evaluation (Signed)
Anesthesia Post Note  Patient: Karren Cobble Zahler  Procedure(s) Performed: DILATATION & CURETTAGE/HYSTEROSCOPY WITH HYDROTHERMAL ABLATION (N/A Vagina )     Patient location during evaluation: Phase II Anesthesia Type: General Level of consciousness: awake and alert, patient cooperative and oriented Pain management: pain level controlled Vital Signs Assessment: post-procedure vital signs reviewed and stable Respiratory status: spontaneous breathing, nonlabored ventilation and respiratory function stable Cardiovascular status: blood pressure returned to baseline and stable Postop Assessment: no apparent nausea or vomiting and adequate PO intake Anesthetic complications: no   No complications documented.  Last Vitals:  Vitals:   02/29/20 1445 02/29/20 1507  BP: 127/82 130/89  Pulse: 79 84  Resp: 16 20  Temp:  36.4 C  SpO2: 100% 100%    Last Pain:  Vitals:   02/29/20 1507  TempSrc: Oral  PainSc: 2                  Rozann Holts,E. Gerrell Tabet

## 2020-02-29 NOTE — H&P (Signed)
Subjective:    Chief Complaint(s):      preop- heavy menses       HPI:          Isolation Precautions          Has patient received COVID-19 vaccination?  Yes- Pfizer , No.  Does patient report new onset of COVID symptoms?  No , No.  Has patient or close contact tested positive for COVID-19?  No , not in the past 2 weeks , No , not in the past 2 weeks.         General          39 yo presents for pre-op visit.            Pt is scheduled for hysteroscopy/D&C w/ hydrothermal ablation on Feb 29, 2020 for management of menorrhagia and fibroids.            Pt last seen Sept 27, 2021 c/o heavier menses that lasted well over a week. She reported changing a pad/tampon every 1 hr. H/o DVT managed by Eliquis. TSH WNL. HGB low at 9.5. Recommended OTC iron supplements.            U/S performed Dec 10, 2019 revealed uterus measuring 8.2 x 5.1 x 4.8 cm. Endometrium measured 1.2 cm. 3 fibroids were noted w/ the largest measuring 1.7 cm. Fibroids stable in size from previous U/S in June 2019. Bilat OV WNL.            Today, pt reports she is no longer on Eliquis. Denies discharge that itches, burns, or has an odor.            Pelvic exam normal.      Current Medication:      Taking   Nu-Iron(Polysaccharide Iron Complex) 150 MG Capsule 1 capsule Orally Once a day.      MVI.         Discontinued   Eliquis(Apixaban) 5 MG Tablet 1 tablet Orally Once a day.      Medication List reviewed and reconciled with the patient.      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    Gyn History:   Sexual activity currently sexually active.   Periods : regular.   LMP 01-22-20.   Birth control BTL.   Last pap smear date 11/30/2019-neg.   Last mammogram date 01/11/2020-pt had to resched.   Denies  Abnormal pap smear.   Denies STD.        OB History:   Number of pregnancies  2.   Pregnancy # 1  live birth, C-section delivery, boy.   Pregnancy # 2  live birth, C-section, girl.        Surgical History:   c-section 04/26/2001      c-section 04/2013      BTL 8.2015       Hospitalization:   childbirth x 2       Family History:   Father: deceased, thrombolitic disease    Mother: alive, hypertension    Paternal Carmi Father: alive, unknown    Paternal Grand Mother: deceased, obesity, diabetes    Maternal Grand Father: alive, stroke, hypertension    Maternal Grand Mother: deceased,  ovarian cancer, diagnosed with Ovarian cancer    Brother 1: deceased, died age 28    Brother2: alive    Sister 1: alive, thrombolitic disease    Sister 2: alive, fibroids    Sister 3: alive    2 brother(s) , 3 sister(s) . 1 son(s) , 1 daughter(s) .          grandmother-CVA, dibetes mellitus; granfather-hypertension, CVA.     Social History:       General         Tobacco use cigarettes:  Never smoked, Tobacco history last updated  02/11/2020.           no EXPOSURE TO PASSIVE SMOKE.           no Alcohol.           Caffeine: yes, tea, coffee.           no Recreational drug use.           Exercise: yes, 3 times weekly.           Marital Status: married.           Children: 1, son (s) 1, , daughter (s).           OCCUPATION: employed, [employer].      ROS:       CONSTITUTIONAL         Chills  No.  Fatigue  No.  Fever  No.  Night sweats  No.  Recent travel outside [place]  No.  Sweats  No.  Weight change  No.         OPHTHALMOLOGY         Blurring of vision  no.  Change in vision  no.  Double vision  no.         ENT         Dizziness  no.  Nose bleeds  no.  Sore throat  no.  Teeth pain  no.         ALLERGY         Hives  no.         CARDIOLOGY         Chest pain  no.  High blood pressure  no.  Irregular heart beat  no.  Leg edema  no.  Palpitations  no.         RESPIRATORY         Shortness  of breath  no.  Cough  no.  Wheezing  no.         UROLOGY         Pain with urination  no.  Urinary urgency  no.  Urinary frequency  no.  Urinary incontinence  no.  Difficulty urinating  No.  Blood in urine  No.         GASTROENTEROLOGY         Abdominal pain  no.  Appetite change  no.  Bloating/belching  no.  Blood in stool or on toilet paper  no.  Change in bowel movements  no.  Constipation  no.  Diarrhea  no.  Difficulty swallowing  no.  Nausea  no.         FEMALE REPRODUCTIVE         Vulvar pain  no.  Vulvar rash  no.  Abnormal vaginal bleeding  heavy menses.  Breast pain  no.  Nipple discharge  no.  Pain with intercourse  no.  Pelvic pain  no.  Unusual  vaginal discharge  no.  Vaginal itching  no.         MUSCULOSKELETAL         Muscle aches  no.         NEUROLOGY         Headache  no.  Tingling/numbness  no.  Weakness  no.         PSYCHOLOGY         Depression  no.  Anxiety  no.  Nervousness  no.  Sleep disturbances  no.  Suicidal ideation  no .         ENDOCRINOLOGY         Excessive thirst  no.  Excessive urination  no.  Hair loss  no.  Heat or cold intolerance  no.         HEMATOLOGY/LYMPH         Abnormal bleeding  no.  Easy bruising  no.  Swollen glands  no.         DERMATOLOGY         New/changing skin lesion  no.  Rash  no.  Sores  no.            Negative except as stated in HPI.   Objective:    Vitals:        Wt 203.8, Wt change 3.8 lb, Ht 60, BMI 39.80, Pulse sitting 105, BP sitting 120/80.     Past Results:    Examination:          General Examination         CONSTITUTIONAL: alert, oriented, NAD .          SKIN: moist, warm.          EYES: Conjunctiva clear.          LUNGS: good I:E efffort noted, CTA bilat.          HEART: RRR.          ABDOMEN: soft, non-tender/non-distended, bowel sounds present .          FEMALE GENITOURINARY: normal external genitalia, labia - unremarkable, vagina - pink moist mucosa, no lesions or abnormal discharge, cervix - no  discharge or lesions or CMT, adnexa - no masses or tenderness, uterus - nontender and normal size on palpation .          PSYCH: affect normal, good eye contact.      Physical Examination:         Pt aware of scribe services today.    Assessment:     Assessment:    Menorrhagia with regular cycle - N92.0 (Primary)      Fibroids - D25.9        Plan:    Treatment:      Menorrhagia with regular cycle          Notes: Pt is scheduled for hysteroscopy/D&C w/ hydrothermal ablation on Feb 29, 2020 for management of menorrhagia and fibroids. Pt is advised she will be able to return home the same day. Discussed risks of hysteroscopy including but not limited to infection, bleeding, possible perforation of the uterus, with the need for further surgery. Discussed risk of blood transfusion and risk of HIV or hep B&C (1 out of 2 million and 1 out of 200,000, respectively) with blood transfusion. Pt is aware of risks and desires blood transfusion if needed. Pt advised to avoid NSAIDs (Aspirin, Aleve, Advil, Ibuprofen, Motrin) from now until surgery given risk of bleeding during surgery. She  may take Tylenol for pain management. She is advised to avoid eating or drinking starting midnight prior to surgery. Pt is advised that she may have watery discharge or cramping after surgery. Discussed post-surgery avoidance of driving for 24 hours and avoidance of lifting weight greater than 10 lbs or intercourse for 2 weeks after procedure. Follow up in 4 weeks for 2 wk post-op visit.      Fibroids          Notes: Pt is scheduled for hysteroscopy/D&C w/ hydrothermal ablation on Feb 29, 2020 for management of menorrhagia and fibroids. Follow up in 4 weeks for 2 wk post-op visit.

## 2020-02-29 NOTE — Discharge Instructions (Signed)
  Next dose of NSAID (Ibuprofen, Aleve, Motrin) can be given after 5:30PM. Next dose of Tylenol can be given after 5:30PM.    Post Anesthesia Home Care Instructions  Activity: Get plenty of rest for the remainder of the day. A responsible individual must stay with you for 24 hours following the procedure.  For the next 24 hours, DO NOT: -Drive a car -Advertising copywriter -Drink alcoholic beverages -Take any medication unless instructed by your physician -Make any legal decisions or sign important papers.  Meals: Start with liquid foods such as gelatin or soup. Progress to regular foods as tolerated. Avoid greasy, spicy, heavy foods. If nausea and/or vomiting occur, drink only clear liquids until the nausea and/or vomiting subsides. Call your physician if vomiting continues.  Special Instructions/Symptoms: Your throat may feel dry or sore from the anesthesia or the breathing tube placed in your throat during surgery. If this causes discomfort, gargle with warm salt water. The discomfort should disappear within 24 hours.  If you had a scopolamine patch placed behind your ear for the management of post- operative nausea and/or vomiting:  1. The medication in the patch is effective for 72 hours, after which it should be removed.  Wrap patch in a tissue and discard in the trash. Wash hands thoroughly with soap and water. 2. You may remove the patch earlier than 72 hours if you experience unpleasant side effects which may include dry mouth, dizziness or visual disturbances. 3. Avoid touching the patch. Wash your hands with soap and water after contact with the patch.

## 2020-02-29 NOTE — Transfer of Care (Signed)
Immediate Anesthesia Transfer of Care Note  Patient: Brenda Rivers  Procedure(s) Performed: DILATATION & CURETTAGE/HYSTEROSCOPY WITH HYDROTHERMAL ABLATION (N/A Vagina )  Patient Location: PACU  Anesthesia Type:General  Level of Consciousness: drowsy  Airway & Oxygen Therapy: Patient Spontanous Breathing and Patient connected to face mask oxygen  Post-op Assessment: Report given to RN and Post -op Vital signs reviewed and stable  Post vital signs: Reviewed and stable  Last Vitals:  Vitals Value Taken Time  BP 119/83 02/29/20 1345  Temp    Pulse 93 02/29/20 1347  Resp 13 02/29/20 1347  SpO2 100 % 02/29/20 1347  Vitals shown include unvalidated device data.  Last Pain:  Vitals:   02/29/20 1117  TempSrc: Oral  PainSc: 0-No pain         Complications: No complications documented.

## 2020-02-29 NOTE — Op Note (Signed)
02/29/2020  1:44 PM  PATIENT:  Brenda Rivers  39 y.o. female  PRE-OPERATIVE DIAGNOSIS:  N92.0 heavy menses   POST-OPERATIVE DIAGNOSIS:  N92.0 heavy menses   PROCEDURE:  Procedure(s): DILATATION & CURETTAGE/HYSTEROSCOPY WITH HYDROTHERMAL ABLATION (N/A)  SURGEON:  Surgeon(s) and Role:    Gerald Leitz, MD - Primary  PHYSICIAN ASSISTANT:   ASSISTANTS: none   ANESTHESIA:   general  EBL:  5 mL   BLOOD ADMINISTERED:none  DRAINS: none   LOCAL MEDICATIONS USED:  MARCAINE     SPECIMEN:  Source of Specimen:  endometrial curettings   DISPOSITION OF SPECIMEN:  PATHOLOGY  COUNTS:  YES  TOURNIQUET:  * No tourniquets in log *  DICTATION: .Dragon Dictation  PLAN OF CARE: Discharge to home after PACU  PATIENT DISPOSITION:  PACU - hemodynamically stable.   Delay start of Pharmacological VTE agent (>24hrs) due to surgical blood loss or risk of bleeding: not applicable  Findings: secretory appearing endometrium small submucosal fibroid located posteriorly. Normal appearing cervix and vagina.   Procedure. The patient was taken to the operative room were a time out was performed. She was placed in the dorsal lithotomy position and prepped and draped in the normal sterile fashion. A speculum was placed in the vaginal vault. The anterior lip of the cervix was grasped with a single tooth tenaculum. The uterus was sounded to 8 cm. The cervix was dilated to 8 mm. The hydrothermal hysteroscope was inserted. The ostia were visualized bilaterally. There was no evidence of perforation. The hydrothermal ablation was performed for 10 minutes.  The hysteroscope was removed. 10 cc of 25% marcaine was injected at the 4 and 8 oclock position.  The single tooth tenaculum was removed. Bleeding was noted from the tenaculum site. Silver nitrate was applied.  Excellent hemostasis was noted.   Sponge lap and needle counts were correct x 2.  The patient was awakened from anesthesia and taken to the recovery  room in stable condition.

## 2020-02-29 NOTE — Anesthesia Procedure Notes (Signed)
Procedure Name: LMA Insertion Date/Time: 02/29/2020 12:55 PM Performed by: Lavonia Dana, CRNA Pre-anesthesia Checklist: Patient identified, Emergency Drugs available, Suction available and Patient being monitored Patient Re-evaluated:Patient Re-evaluated prior to induction Oxygen Delivery Method: Circle system utilized Preoxygenation: Pre-oxygenation with 100% oxygen Induction Type: IV induction Ventilation: Mask ventilation without difficulty LMA: LMA inserted LMA Size: 4.0 Number of attempts: 1 Airway Equipment and Method: Bite block Placement Confirmation: positive ETCO2 Tube secured with: Tape Dental Injury: Teeth and Oropharynx as per pre-operative assessment

## 2020-02-29 NOTE — H&P (Signed)
Date of Initial H&P: 02/29/2020 History reviewed, patient examined, no change in status, stable for surgery.

## 2020-03-01 ENCOUNTER — Encounter (HOSPITAL_BASED_OUTPATIENT_CLINIC_OR_DEPARTMENT_OTHER): Payer: Self-pay | Admitting: Obstetrics and Gynecology

## 2020-03-01 LAB — SURGICAL PATHOLOGY

## 2020-11-12 IMAGING — MG DIGITAL SCREENING BILAT W/ TOMO W/ CAD
8 series · 8 of 24 positions shown · non-contrast
Comparison: None.

CLINICAL DATA: Screening.

EXAM:
DIGITAL SCREENING BILATERAL MAMMOGRAM WITH TOMO AND CAD

[L CC synth-2D]
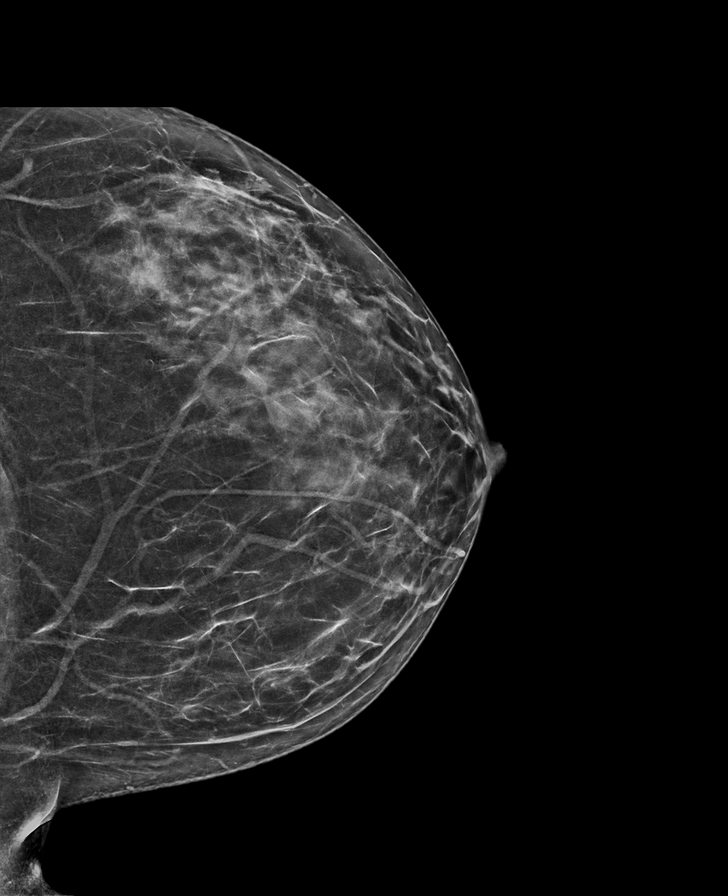

[R MLO synth-2D]
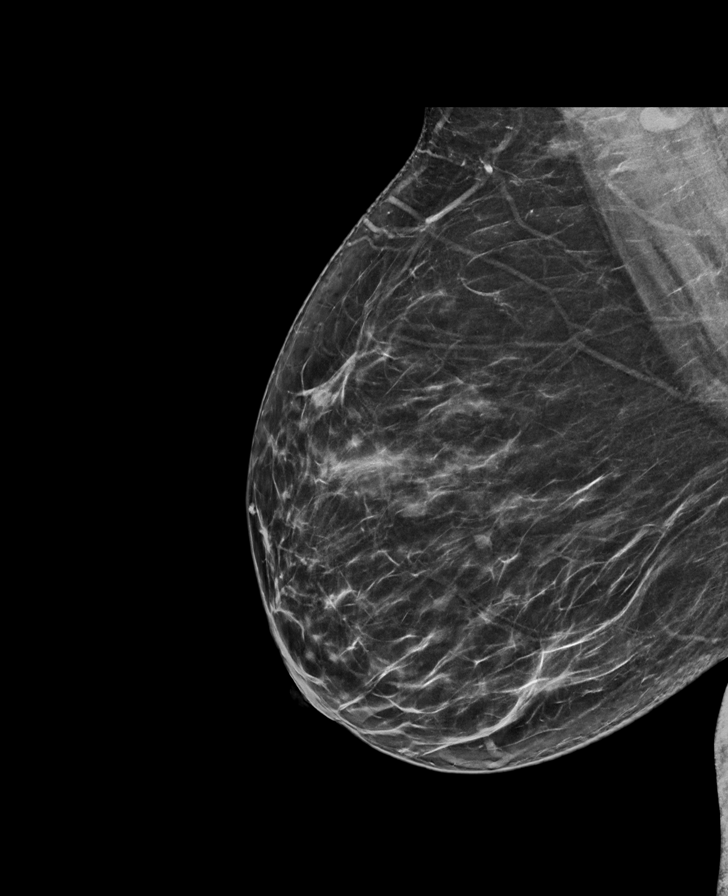

[R CC synth-2D]
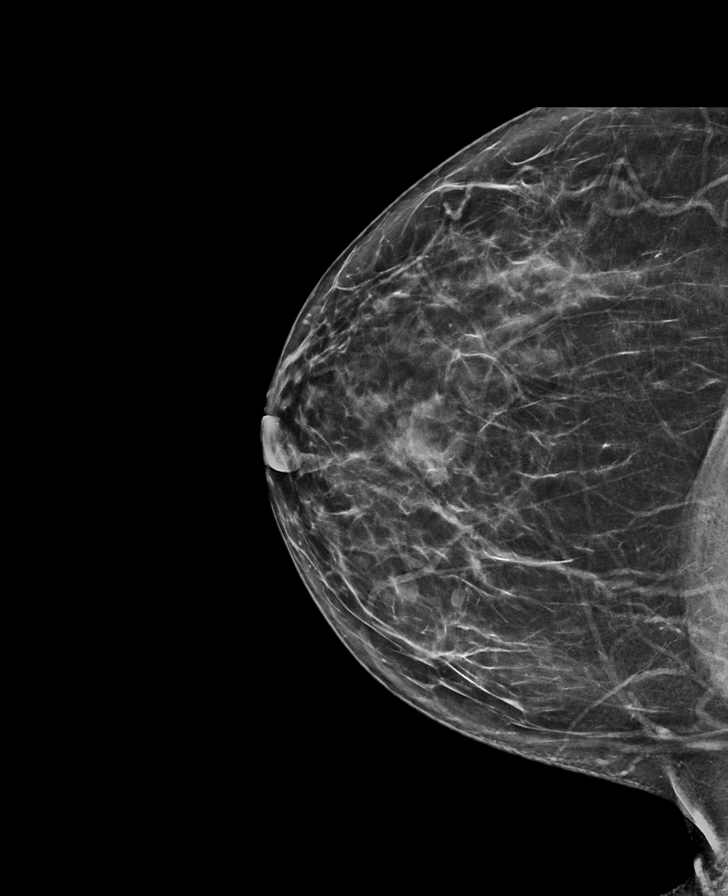

[L MLO synth-2D]
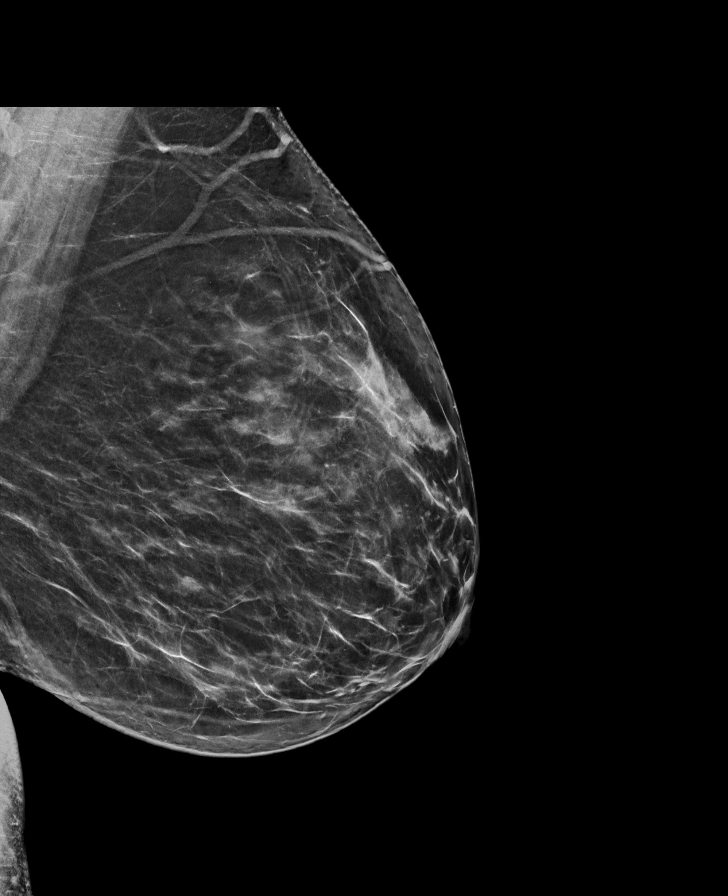

[R MLO tomo · tomo slice 39/78.0]
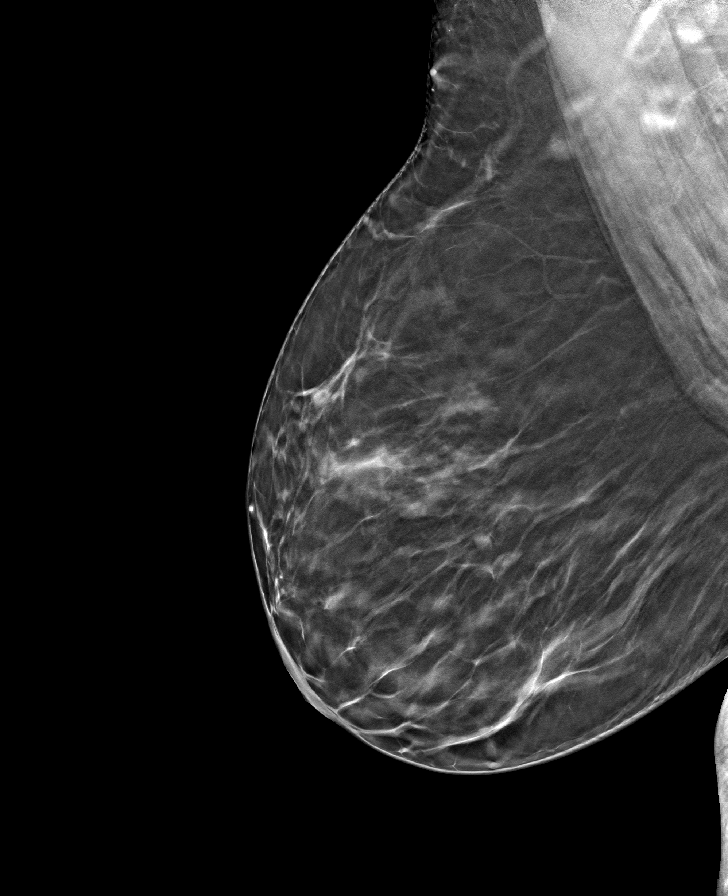

[L CC tomo · tomo slice 35/69.0]
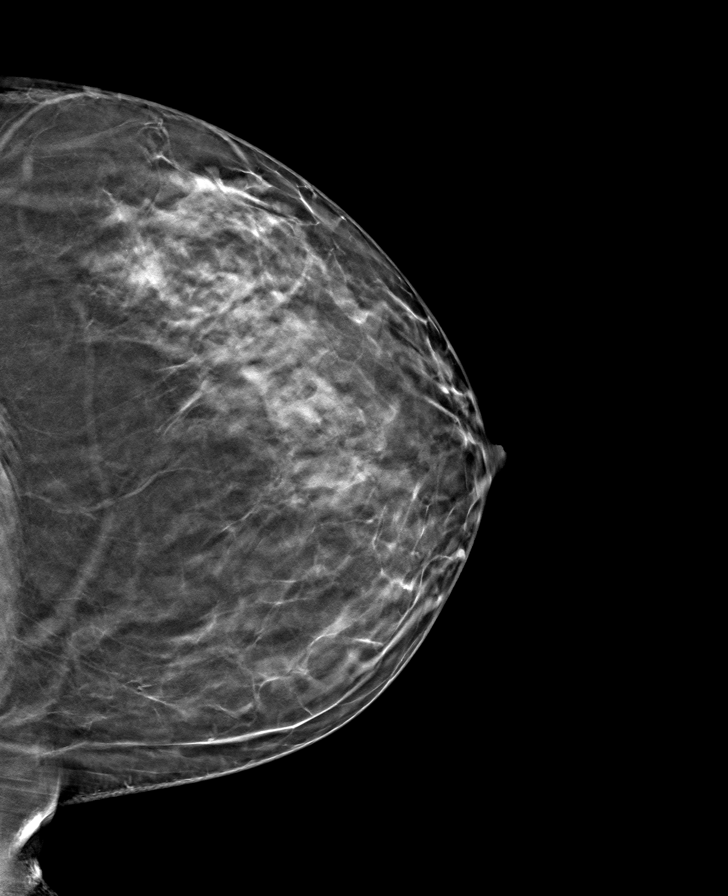

[R CC tomo · tomo slice 38/75.0]
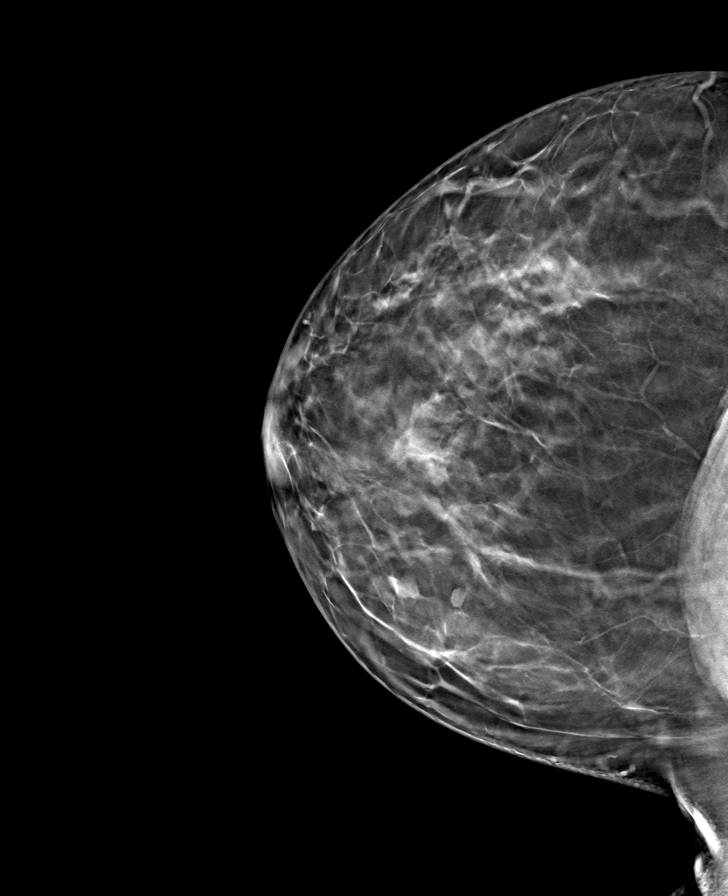

[L MLO tomo · tomo slice 39/77.0]
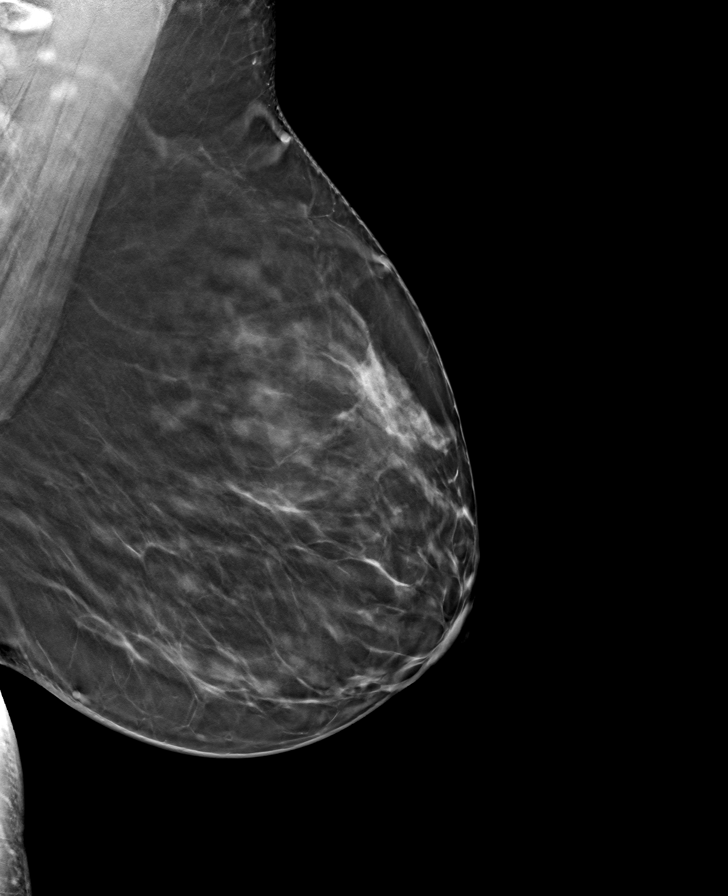

[8 of 24 positions shown; findings below may reference images not displayed]

ACR Breast Density Category c: The breast tissue is heterogeneously
dense, which may obscure small masses
FINDINGS: There are no findings suspicious for malignancy. Images were
processed with CAD.
IMPRESSION: No mammographic evidence of malignancy. A result letter of this
screening mammogram will be mailed directly to the patient.

RECOMMENDATION:
Screening mammogram in one year. (Code:[id number]-[id number])

BI-RADS CATEGORY  1: Negative.

## 2020-11-22 ENCOUNTER — Inpatient Hospital Stay: Payer: Managed Care, Other (non HMO) | Admitting: Hematology and Oncology

## 2021-12-12 ENCOUNTER — Other Ambulatory Visit: Payer: Self-pay | Admitting: Obstetrics and Gynecology

## 2021-12-12 DIAGNOSIS — Z1231 Encounter for screening mammogram for malignant neoplasm of breast: Secondary | ICD-10-CM

## 2022-02-05 ENCOUNTER — Ambulatory Visit
Admission: RE | Admit: 2022-02-05 | Discharge: 2022-02-05 | Disposition: A | Discharge status: Home / Self Care | Payer: Managed Care, Other (non HMO) | Source: Ambulatory Visit | Attending: Obstetrics and Gynecology | Admitting: Obstetrics and Gynecology

## 2022-02-05 DIAGNOSIS — Z1231 Encounter for screening mammogram for malignant neoplasm of breast: Secondary | ICD-10-CM

## 2022-02-06 ENCOUNTER — Other Ambulatory Visit: Payer: Self-pay | Admitting: Obstetrics and Gynecology

## 2022-02-06 DIAGNOSIS — R928 Other abnormal and inconclusive findings on diagnostic imaging of breast: Secondary | ICD-10-CM

## 2022-02-16 ENCOUNTER — Ambulatory Visit
Admission: RE | Admit: 2022-02-16 | Discharge: 2022-02-16 | Disposition: A | Discharge status: Home / Self Care | Payer: Managed Care, Other (non HMO) | Source: Ambulatory Visit | Attending: Obstetrics and Gynecology | Admitting: Obstetrics and Gynecology

## 2022-02-16 ENCOUNTER — Other Ambulatory Visit: Payer: Self-pay | Admitting: Obstetrics and Gynecology

## 2022-02-16 DIAGNOSIS — N6320 Unspecified lump in the left breast, unspecified quadrant: Secondary | ICD-10-CM

## 2022-02-16 DIAGNOSIS — R928 Other abnormal and inconclusive findings on diagnostic imaging of breast: Secondary | ICD-10-CM

## 2022-02-23 ENCOUNTER — Other Ambulatory Visit: Payer: Self-pay | Admitting: Obstetrics and Gynecology

## 2022-02-23 ENCOUNTER — Other Ambulatory Visit: Payer: Managed Care, Other (non HMO)

## 2022-02-23 ENCOUNTER — Ambulatory Visit
Admission: RE | Admit: 2022-02-23 | Discharge: 2022-02-23 | Disposition: A | Discharge status: Home / Self Care | Payer: Managed Care, Other (non HMO) | Source: Ambulatory Visit | Attending: Obstetrics and Gynecology | Admitting: Obstetrics and Gynecology

## 2022-02-23 ENCOUNTER — Other Ambulatory Visit: Payer: Self-pay

## 2022-02-23 ENCOUNTER — Ambulatory Visit: Payer: Managed Care, Other (non HMO)

## 2022-02-23 DIAGNOSIS — N6320 Unspecified lump in the left breast, unspecified quadrant: Secondary | ICD-10-CM

## 2022-02-23 HISTORY — PX: BREAST BIOPSY: SHX20

## 2023-01-30 ENCOUNTER — Other Ambulatory Visit: Payer: Self-pay | Admitting: Obstetrics and Gynecology

## 2023-01-30 DIAGNOSIS — Z1231 Encounter for screening mammogram for malignant neoplasm of breast: Secondary | ICD-10-CM

## 2023-01-30 LAB — CYTOLOGY - PAP
Comment: NEGATIVE
Diagnosis: NEGATIVE
High risk HPV: NEGATIVE

## 2023-02-13 ENCOUNTER — Ambulatory Visit
Admission: RE | Admit: 2023-02-13 | Discharge: 2023-02-13 | Disposition: A | Discharge status: Home / Self Care | Payer: Managed Care, Other (non HMO) | Source: Ambulatory Visit | Attending: Obstetrics and Gynecology | Admitting: Obstetrics and Gynecology

## 2023-02-13 DIAGNOSIS — Z1231 Encounter for screening mammogram for malignant neoplasm of breast: Secondary | ICD-10-CM

## 2023-08-23 ENCOUNTER — Other Ambulatory Visit: Payer: Self-pay | Admitting: *Deleted

## 2023-08-23 DIAGNOSIS — I82442 Acute embolism and thrombosis of left tibial vein: Secondary | ICD-10-CM

## 2023-08-25 NOTE — Assessment & Plan Note (Signed)
 History of protein S deficiency diagnosed during pregnancy and was previously on Lovenox . History of prothrombin gene mutation and a recent DVT, diagnosed after she has swelling in her left lower extremity. US  on 02/17/19 showed an acute DVT, and is currently on anticoagulation with Eliquis .    Other Risk factors: 1. sedentariness related to work: Encouraged her to use a standing desk,  2. long car ride  3. high BMI/obesity   Hypercoagulability work-up done in 2008: Negative for lupus anticoagulant, protein S activity: 58%, protein S antigen 129%, protein C activity 85%, protein C antigen 85%   Hyper coag W/U 03/31/19: Neg for lupus anticoagulant, Neg for PT gene mutation or Factor 5 leiden, Prot S 77%   She completed 6 months of anticoaulation and discontinued 09/30/2019. ---------------------------------------------------------------------------------------------------------------------------------------------------------- July 2021: Ultrasound lower extremity negative for DVT 10/27/2019: SVT left basilic vein: Plan is 3 months of anticoagulation which will be completed end of #2021. Heavy uterine bleeding: Patient attributes this to being on blood thinners.  Her gynecologist suggested microablation versus hysterectomy.  I think micro ablation sounds reasonable to try. Since she will stop her anticoagulation November, we can wait and see if her uterine bleeding gets better after that.   If she gets any other future superficial or deep vein thrombosis then we will need to keep her on blood thinners for life. I instructed her next time to call us  directly so that we can arrange for the ultrasound and follow-up on that. Return to clinic on an as-needed basis.

## 2023-08-26 ENCOUNTER — Inpatient Hospital Stay: Attending: Hematology and Oncology | Admitting: Hematology and Oncology

## 2023-08-26 ENCOUNTER — Ambulatory Visit (HOSPITAL_BASED_OUTPATIENT_CLINIC_OR_DEPARTMENT_OTHER)
Admission: RE | Admit: 2023-08-26 | Discharge: 2023-08-26 | Disposition: A | Discharge status: Home / Self Care | Source: Ambulatory Visit | Attending: Hematology and Oncology | Admitting: Hematology and Oncology

## 2023-08-26 ENCOUNTER — Inpatient Hospital Stay

## 2023-08-26 ENCOUNTER — Other Ambulatory Visit: Payer: Self-pay

## 2023-08-26 ENCOUNTER — Telehealth: Payer: Self-pay

## 2023-08-26 VITALS — BP 130/82 | HR 79 | Temp 97.6°F | Resp 18 | Wt 209.0 lb

## 2023-08-26 DIAGNOSIS — E669 Obesity, unspecified: Secondary | ICD-10-CM | POA: Diagnosis not present

## 2023-08-26 DIAGNOSIS — I82442 Acute embolism and thrombosis of left tibial vein: Secondary | ICD-10-CM | POA: Insufficient documentation

## 2023-08-26 DIAGNOSIS — Z86718 Personal history of other venous thrombosis and embolism: Secondary | ICD-10-CM | POA: Insufficient documentation

## 2023-08-26 DIAGNOSIS — D6852 Prothrombin gene mutation: Secondary | ICD-10-CM | POA: Insufficient documentation

## 2023-08-26 DIAGNOSIS — Z7901 Long term (current) use of anticoagulants: Secondary | ICD-10-CM | POA: Insufficient documentation

## 2023-08-26 LAB — CMP (CANCER CENTER ONLY)
ALT: 40 U/L (ref 0–44)
AST: 17 U/L (ref 15–41)
Albumin: 3.8 g/dL (ref 3.5–5.0)
Alkaline Phosphatase: 88 U/L (ref 38–126)
Anion gap: 5 (ref 5–15)
BUN: 13 mg/dL (ref 6–20)
CO2: 31 mmol/L (ref 22–32)
Calcium: 9.2 mg/dL (ref 8.9–10.3)
Chloride: 102 mmol/L (ref 98–111)
Creatinine: 0.94 mg/dL (ref 0.44–1.00)
GFR, Estimated: >60 mL/min (ref >60)
Glucose, Bld: 86 mg/dL (ref 70–99)
Potassium: 4.4 mmol/L (ref 3.5–5.1)
Sodium: 138 mmol/L (ref 135–145)
Total Bilirubin: 0.3 mg/dL (ref 0.0–1.2)
Total Protein: 7.9 g/dL (ref 6.5–8.1)

## 2023-08-26 LAB — CBC WITH DIFFERENTIAL (CANCER CENTER ONLY)
Abs Immature Granulocytes: 0.03 10*3/uL (ref 0.00–0.07)
Basophils Absolute: 0 10*3/uL (ref 0.0–0.1)
Basophils Relative: 0 %
Eosinophils Absolute: 0.1 10*3/uL (ref 0.0–0.5)
Eosinophils Relative: 1 %
HCT: 42.2 % (ref 36.0–46.0)
Hemoglobin: 13.1 g/dL (ref 12.0–15.0)
Immature Granulocytes: 0 %
Lymphocytes Relative: 29 %
Lymphs Abs: 2.4 10*3/uL (ref 0.7–4.0)
MCH: 25.9 pg — ABNORMAL LOW (ref 26.0–34.0)
MCHC: 31 g/dL (ref 30.0–36.0)
MCV: 83.4 fL (ref 80.0–100.0)
Monocytes Absolute: 0.6 10*3/uL (ref 0.1–1.0)
Monocytes Relative: 7 %
Neutro Abs: 5.1 10*3/uL (ref 1.7–7.7)
Neutrophils Relative %: 63 %
Platelet Count: 324 10*3/uL (ref 150–400)
RBC: 5.06 MIL/uL (ref 3.87–5.11)
RDW: 13.8 % (ref 11.5–15.5)
WBC Count: 8.2 10*3/uL (ref 4.0–10.5)
nRBC: 0 % (ref 0.0–0.2)

## 2023-08-26 MED ORDER — APIXABAN (ELIQUIS) VTE STARTER PACK (10MG AND 5MG): ORAL_TABLET | ORAL | 0 refills | Status: DC

## 2023-08-26 NOTE — Progress Notes (Signed)
 Patient Care Team: [facility], [place] as PCP - General  DIAGNOSIS:  Encounter Diagnosis  Name Primary?   Acute deep vein thrombosis (DVT) of tibial vein of left lower extremity (HCC) Yes    CHIEF COMPLIANT: Leg swelling after returning from a drive from [place]   HISTORY OF PRESENT ILLNESS:  History of Present Illness Brenda Rivers is a 43 year old female with a history of deep vein thrombosis who presents with new right leg pain.  She experiences pain in the right leg, located at the back, which began after a long drive from [place]  to [facility] and back. During the trip, she did not wear compression socks and did not take breaks to move around. She has a history of DVT in the left leg and left arm. Currently, there is no pain in the left leg, although it does swell. The right leg pain has not been evaluated prior to this visit.     ALLERGIES:  has no known allergies.  MEDICATIONS:  Current Outpatient Medications  Medication Sig Dispense Refill   APIXABAN  (ELIQUIS ) VTE STARTER PACK (10MG  AND 5MG ) Take as directed on package: start with two-5mg  tablets twice daily for 7 days. On day 8, switch to one-5mg  tablet twice daily. 74 each 0   Multiple Vitamins-Iron  (ONE DAILY MULTIVITAMIN/IRON  PO) Take by mouth.     No current facility-administered medications for this visit.    PHYSICAL EXAMINATION: ECOG PERFORMANCE STATUS: 1 - Symptomatic but completely ambulatory  Vitals:   08/26/23 1302  BP: 130/82  Pulse: 79  Resp: 18  Temp: 97.6 F (36.4 C)  SpO2: 100%   Filed Weights   08/26/23 1302  Weight: 209 lb (94.8 kg)    Physical Exam Leg swelling  (exam performed in the presence of a chaperone)  LABORATORY DATA:  I have reviewed the data as listed    Latest Ref Rng & Units 08/26/2023   12:48 PM 02/12/2007   11:26 AM  CMP  Glucose 70 - 99 mg/dL 86  77   BUN 6 - 20 mg/dL 13  11   Creatinine 9.55 - 1.00 mg/dL 9.05  9.17   Sodium 864 -  145 mmol/L 138  139   Potassium 3.5 - 5.1 mmol/L 4.4  4.3   Chloride 98 - 111 mmol/L 102  102   CO2 22 - 32 mmol/L 31  27   Calcium 8.9 - 10.3 mg/dL 9.2  8.9   Total Protein 6.5 - 8.1 g/dL 7.9  7.8   Total Bilirubin 0.0 - 1.2 mg/dL 0.3  0.3   Alkaline Phos 38 - 126 U/L 88  95   AST 15 - 41 U/L 17  15   ALT 0 - 44 U/L 40  15     Lab Results  Component Value Date   WBC 8.2 08/26/2023   HGB 13.1 08/26/2023   HCT 42.2 08/26/2023   MCV 83.4 08/26/2023   PLT 324 08/26/2023   NEUTROABS 5.1 08/26/2023    ASSESSMENT & PLAN:  Left leg DVT (HCC) History of protein S deficiency diagnosed during pregnancy and was previously on Lovenox . History of prothrombin gene mutation and a recent DVT, diagnosed after she has swelling in her left lower extremity. US  on 02/17/19 showed an acute DVT, and is currently on anticoagulation with Eliquis .    Other Risk factors: 1. sedentariness related to work: Encouraged her to use a standing desk,  2. long car ride  3. high BMI/obesity  Hypercoagulability work-up done in 2008: Negative for lupus anticoagulant, protein S activity: 58%, protein S antigen 129%, protein C activity 85%, protein C antigen 85%   Hyper coag W/U 03/31/19: Neg for lupus anticoagulant, Neg for PT gene mutation or Factor 5 leiden, Prot S 77%   She completed 6 months of anticoaulation and discontinued 09/30/2019. ---------------------------------------------------------------------------------------------------------------------------------------------------------- July 2021: Ultrasound lower extremity negative for DVT 10/27/2019: SVT left basilic vein: Plan is 3 months of anticoagulation which will be completed end of #2021. Heavy uterine bleeding: Patient attributes this to being on blood thinners.    Assessment & Plan Pain in right leg Acute pain in the right leg post long car ride. Differential includes DVT. - Order bilateral lower extremity ultrasound for DVT assessment. -  Confirmed presence of DVT. - Initiate anticoagulation with Eliquis   Deep vein thrombosis (DVT) History of DVT in left leg and arm. Current concern for right leg DVT post prolonged immobility. - Discuss potential long-term anticoagulation.   F/U in 3 months   No orders of the defined types were placed in this encounter.  The patient has a good understanding of the overall plan. she agrees with it. she will call with any problems that may develop before the next visit here. Total time spent: 30 mins including face to face time and time spent for planning, charting and co-ordination of care   Viinay K Alessander Sikorski, MD 08/26/23

## 2023-08-26 NOTE — Progress Notes (Signed)
 Pt is positive for BLE DVTS, Per Dr Tina, order placed for eliquis  starter pack.

## 2023-08-26 NOTE — Telephone Encounter (Signed)
 Received call from Candace in CV proc to make us  aware pt is positive for BLE DVTs to her calves. Right is acute, but unsure if left is acute or chronic. MD made aware.

## 2023-08-26 NOTE — Progress Notes (Signed)
 Orders placed for BLE dopplers per MD. S/w Arland in [facility] who scheduled pt for dopplers. Pt was given instructions where to go and she left [facility] ambulatory.

## 2023-08-26 NOTE — Progress Notes (Signed)
 VASCULAR LAB    Bilateral lower extremity venous duplex has been performed.  See CV proc for preliminary results.  Called report to Leita LIS, [facility], RVT 08/26/2023, 3:29 PM

## 2023-08-28 ENCOUNTER — Other Ambulatory Visit: Payer: Self-pay | Admitting: Hematology and Oncology

## 2023-08-28 MED ORDER — APIXABAN (ELIQUIS) VTE STARTER PACK (10MG AND 5MG): ORAL_TABLET | ORAL | Status: DC

## 2023-09-27 ENCOUNTER — Other Ambulatory Visit: Payer: Self-pay

## 2023-12-02 ENCOUNTER — Inpatient Hospital Stay: Attending: Hematology and Oncology | Admitting: Hematology and Oncology

## 2023-12-02 VITALS — BP 130/72 | HR 60 | Temp 97.6°F | Resp 16 | Ht 60.0 in | Wt 205.7 lb

## 2023-12-02 DIAGNOSIS — Z7901 Long term (current) use of anticoagulants: Secondary | ICD-10-CM | POA: Diagnosis not present

## 2023-12-02 DIAGNOSIS — Z86718 Personal history of other venous thrombosis and embolism: Secondary | ICD-10-CM | POA: Insufficient documentation

## 2023-12-02 DIAGNOSIS — D6852 Prothrombin gene mutation: Secondary | ICD-10-CM | POA: Diagnosis not present

## 2023-12-02 DIAGNOSIS — I82442 Acute embolism and thrombosis of left tibial vein: Secondary | ICD-10-CM

## 2023-12-02 NOTE — Progress Notes (Signed)
 Patient Care Team: [facility], [place] as PCP - General  DIAGNOSIS:  Encounter Diagnosis  Name Primary?   Acute deep vein thrombosis (DVT) of tibial vein of left lower extremity (HCC) Yes   CHIEF COMPLIANT: Recurrent blood clots  HISTORY OF PRESENT ILLNESS:  History of Present Illness Brenda Rivers is a 43 year old female with recurrent blood clots who presents for management of her condition.  She experienced her first blood clot in December 2020 while jump roping, followed by a second, smaller clot approximately nine months later. Recently, she developed another clot after a long car trip to [place], likely due to prolonged immobility. Pain associated with this clot began about a week after returning from the trip.  She is currently on Eliquis  and has not experienced any bleeding issues while on this medication.  Her family history is significant for blood clots, with both her father and brother having passed away due to this condition.     ALLERGIES:  has no known allergies.  MEDICATIONS:  Current Outpatient Medications  Medication Sig Dispense Refill   apixaban  (ELIQUIS ) 5 MG TABS tablet STRAT WITH 2 X 5 MG TABLETS TWICE DAILY FOR 7 DAYS THEN TAKE 1 TABLET TWICE DAILY THEREAFTER. 60 tablet 6   APIXABAN  (ELIQUIS ) VTE STARTER PACK (10MG  AND 5MG ) Take as directed on package: start with two-5mg  tablets twice daily for 7 days. On day 8, switch to one-5mg  tablet twice daily.     Multiple Vitamins-Iron  (ONE DAILY MULTIVITAMIN/IRON  PO) Take by mouth.     No current facility-administered medications for this visit.    PHYSICAL EXAMINATION: ECOG PERFORMANCE STATUS: 1 - Symptomatic but completely ambulatory  Vitals:   12/02/23 1352  BP: 130/72  Pulse: 60  Resp: 16  Temp: 97.6 F (36.4 C)  SpO2: 100%   Filed Weights   12/02/23 1352  Weight: 205 lb 11.2 oz (93.3 kg)      LABORATORY DATA:  I have reviewed the data as listed    Latest Ref Rng & Units  08/26/2023   12:48 PM 02/12/2007   11:26 AM  CMP  Glucose 70 - 99 mg/dL 86  77   BUN 6 - 20 mg/dL 13  11   Creatinine 9.55 - 1.00 mg/dL 9.05  9.17   Sodium 864 - 145 mmol/L 138  139   Potassium 3.5 - 5.1 mmol/L 4.4  4.3   Chloride 98 - 111 mmol/L 102  102   CO2 22 - 32 mmol/L 31  27   Calcium 8.9 - 10.3 mg/dL 9.2  8.9   Total Protein 6.5 - 8.1 g/dL 7.9  7.8   Total Bilirubin 0.0 - 1.2 mg/dL 0.3  0.3   Alkaline Phos 38 - 126 U/L 88  95   AST 15 - 41 U/L 17  15   ALT 0 - 44 U/L 40  15     Lab Results  Component Value Date   WBC 8.2 08/26/2023   HGB 13.1 08/26/2023   HCT 42.2 08/26/2023   MCV 83.4 08/26/2023   PLT 324 08/26/2023   NEUTROABS 5.1 08/26/2023    ASSESSMENT & PLAN:  Left leg DVT (HCC) History of protein S deficiency diagnosed during pregnancy and was previously on Lovenox . History of prothrombin gene mutation and a recent DVT, diagnosed after she has swelling in her left lower extremity. US  on 02/17/19 showed an acute DVT, and is currently on anticoagulation with Eliquis .    Other Risk factors: 1.  sedentariness related to work: Encouraged her to use a standing desk,  2. long car ride  3. high BMI/obesity   Hypercoagulability work-up done in 2008: Negative for lupus anticoagulant, protein S activity: 58%, protein S antigen 129%, protein C activity 85%, protein C antigen 85%   Hyper coag W/U 03/31/19: Neg for lupus anticoagulant, Neg for PT gene mutation or Factor 5 leiden, Prot S 77%   She completed 6 months of anticoaulation and discontinued 09/30/2019. ---------------------------------------------------------------------------------------------------------------------------------------------------------- July 2021: Ultrasound lower extremity negative for DVT 10/27/2019: SVT left basilic vein: 3 months of anticoagulation which will be completed end of 2021. Heavy uterine bleeding: Patient attributes this to being on blood thinners.  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 Indefinite anticoagulation required due to multiple clots and family history. No bleeding issues. Weight loss beneficial. 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 - Continue Apixaban  indefinitely, annual phone follow-up for prescription renewal. - Inform healthcare providers of anticoagulation before procedures. - Stop Apixaban  2 days before minor procedures, restart day after without Lovenox . - Coordinate with provider for Lovenox  bridging for major surgeries. - Encourage mobility during long travels, walk every two hours. - Recommend compression hose during travel, not critical on anticoagulation.      No orders of the defined types were placed in this encounter.  The patient has a good understanding of the overall plan. she agrees with it. she will call with any problems that may develop before the next visit here. Total time spent: 30 mins including face to face time and time spent for planning, charting and co-ordination of care   Naomi MARLA Chad, MD 12/02/23

## 2023-12-02 NOTE — Assessment & Plan Note (Signed)
 History of protein S deficiency diagnosed during pregnancy and was previously on Lovenox . History of prothrombin gene mutation and a recent DVT, diagnosed after she has swelling in her left lower extremity. US  on 02/17/19 showed an acute DVT, and is currently on anticoagulation with Eliquis .    Other Risk factors: 1. sedentariness related to work: Encouraged her to use a standing desk,  2. long car ride  3. high BMI/obesity   Hypercoagulability work-up done in 2008: Negative for lupus anticoagulant, protein S activity: 58%, protein S antigen 129%, protein C activity 85%, protein C antigen 85%   Hyper coag W/U 03/31/19: Neg for lupus anticoagulant, Neg for PT gene mutation or Factor 5 leiden, Prot S 77%   She completed 6 months of anticoaulation and discontinued 09/30/2019. ---------------------------------------------------------------------------------------------------------------------------------------------------------- July 2021: Ultrasound lower extremity negative for DVT 10/27/2019: SVT left basilic vein: 3 months of anticoagulation which will be completed end of 2021. Heavy uterine bleeding: Patient attributes this to being on blood thinners.  08/26/2023: DVT right posterior tibial and right peroneal veins  Recommendation: Lifelong anticoagulation

## 2024-02-12 ENCOUNTER — Other Ambulatory Visit: Payer: Self-pay | Admitting: Hematology and Oncology

## 2024-04-02 ENCOUNTER — Other Ambulatory Visit: Payer: Self-pay | Admitting: Internal Medicine

## 2024-04-02 DIAGNOSIS — Z1231 Encounter for screening mammogram for malignant neoplasm of breast: Secondary | ICD-10-CM

## 2024-04-09 ENCOUNTER — Encounter: Payer: Self-pay | Admitting: *Deleted

## 2024-04-09 NOTE — Progress Notes (Signed)
 Received medical clearance from [facility] requesting pt to hold Eliquis  prior to upcoming colonoscopy.  Per MD okay to hold 2 days prior and resume day after, no need for Lovenox  at this time.  RN successfully faxed clearance to [phone].

## 2024-04-16 ENCOUNTER — Ambulatory Visit

## 2024-12-01 ENCOUNTER — Telehealth: Admitting: Hematology and Oncology
